# Patient Record
Sex: Female | Born: 1975
Health system: Southern US, Community
[De-identification: ages and names within clinical notes are randomized; demographics above are authoritative.]

## PROBLEM LIST (undated history)

## (undated) DIAGNOSIS — B009 Herpesviral infection, unspecified: Secondary | ICD-10-CM

## (undated) DIAGNOSIS — Z8673 Personal history of transient ischemic attack (TIA), and cerebral infarction without residual deficits: Secondary | ICD-10-CM

## (undated) DIAGNOSIS — F17210 Nicotine dependence, cigarettes, uncomplicated: Secondary | ICD-10-CM

## (undated) DIAGNOSIS — G43909 Migraine, unspecified, not intractable, without status migrainosus: Secondary | ICD-10-CM

## (undated) DIAGNOSIS — E785 Hyperlipidemia, unspecified: Secondary | ICD-10-CM

## (undated) DIAGNOSIS — Z87898 Personal history of other specified conditions: Secondary | ICD-10-CM

## (undated) HISTORY — DX: Nicotine dependence, cigarettes, uncomplicated: F17.210

## (undated) HISTORY — DX: Herpesviral infection, unspecified: B00.9

## (undated) HISTORY — DX: Migraine, unspecified, not intractable, without status migrainosus: G43.909

## (undated) HISTORY — DX: Personal history of other specified conditions: Z87.898

## (undated) HISTORY — DX: Hyperlipidemia, unspecified: E78.5

## (undated) HISTORY — DX: Personal history of transient ischemic attack (TIA), and cerebral infarction without residual deficits: Z86.73

## (undated) HISTORY — PX: OTHER SURGICAL HISTORY: SHX169

---

## 1998-04-12 ENCOUNTER — Ambulatory Visit (HOSPITAL_COMMUNITY): Admission: RE | Admit: 1998-04-12 | Discharge: 1998-04-12 | Payer: Self-pay | Admitting: Obstetrics and Gynecology

## 1998-05-30 ENCOUNTER — Inpatient Hospital Stay (HOSPITAL_COMMUNITY): Admission: AD | Admit: 1998-05-30 | Discharge: 1998-05-30 | Payer: Self-pay | Admitting: *Deleted

## 1998-05-31 ENCOUNTER — Inpatient Hospital Stay (HOSPITAL_COMMUNITY): Admission: AD | Admit: 1998-05-31 | Discharge: 1998-06-02 | Payer: Self-pay | Admitting: Obstetrics and Gynecology

## 2006-11-08 ENCOUNTER — Ambulatory Visit: Payer: Self-pay | Admitting: Pulmonary Disease

## 2007-06-27 ENCOUNTER — Ambulatory Visit: Payer: Self-pay | Admitting: Pulmonary Disease

## 2007-06-27 LAB — CONVERTED CEMR LAB
AST: 21 units/L (ref 0–37)
Alkaline Phosphatase: 60 units/L (ref 39–117)
Bilirubin, Direct: 0.1 mg/dL (ref 0.0–0.3)
CO2: 31 meq/L (ref 19–32)
Chloride: 110 meq/L (ref 96–112)
Eosinophils Relative: 1.8 % (ref 0.0–5.0)
GFR calc non Af Amer: 90 mL/min
Glucose, Bld: 94 mg/dL (ref 70–99)
HCT: 40.1 % (ref 36.0–46.0)
MCV: 87.7 fL (ref 78.0–100.0)
Monocytes Relative: 5.9 % (ref 3.0–11.0)
Sodium: 146 meq/L — ABNORMAL HIGH (ref 135–145)
TSH: 1.78 microintl units/mL (ref 0.35–5.50)
Total Protein: 6.9 g/dL (ref 6.0–8.3)
WBC: 9.4 10*3/uL (ref 4.5–10.5)

## 2007-07-10 ENCOUNTER — Encounter: Admission: RE | Admit: 2007-07-10 | Discharge: 2007-07-10 | Payer: Self-pay

## 2007-07-10 ENCOUNTER — Encounter: Payer: Self-pay | Admitting: Pulmonary Disease

## 2007-07-14 ENCOUNTER — Encounter: Payer: Self-pay | Admitting: Pulmonary Disease

## 2007-07-22 ENCOUNTER — Encounter: Payer: Self-pay | Admitting: Pulmonary Disease

## 2007-07-22 ENCOUNTER — Ambulatory Visit: Payer: Self-pay

## 2007-07-24 ENCOUNTER — Encounter: Payer: Self-pay | Admitting: Pulmonary Disease

## 2007-07-26 ENCOUNTER — Encounter: Payer: Self-pay | Admitting: Pulmonary Disease

## 2007-08-12 ENCOUNTER — Ambulatory Visit: Payer: Self-pay | Admitting: Pulmonary Disease

## 2007-08-15 ENCOUNTER — Inpatient Hospital Stay (HOSPITAL_COMMUNITY): Admission: EM | Admit: 2007-08-15 | Discharge: 2007-08-17 | Payer: Self-pay | Admitting: Emergency Medicine

## 2007-08-16 ENCOUNTER — Encounter: Payer: Self-pay | Admitting: Pulmonary Disease

## 2007-08-23 ENCOUNTER — Ambulatory Visit: Payer: Self-pay | Admitting: Pulmonary Disease

## 2007-08-23 LAB — CONVERTED CEMR LAB
ALT: 59 units/L — ABNORMAL HIGH (ref 0–35)
Alkaline Phosphatase: 56 units/L (ref 39–117)
BUN: 15 mg/dL (ref 6–23)
Basophils Relative: 0 % (ref 0.0–1.0)
GFR calc non Af Amer: 69 mL/min
Glucose, Bld: 85 mg/dL (ref 70–99)
HCT: 41.9 % (ref 36.0–46.0)
Hemoglobin: 14.8 g/dL (ref 12.0–15.0)
Lymphocytes Relative: 38.5 % (ref 12.0–46.0)
MCHC: 35.4 g/dL (ref 30.0–36.0)
MCV: 86.9 fL (ref 78.0–100.0)
Monocytes Relative: 8.5 % (ref 3.0–11.0)
Neutro Abs: 4.4 10*3/uL (ref 1.4–7.7)
Neutrophils Relative %: 50.8 % (ref 43.0–77.0)
RBC: 4.82 M/uL (ref 3.87–5.11)
Sodium: 141 meq/L (ref 135–145)
Total Bilirubin: 0.6 mg/dL (ref 0.3–1.2)
Total Protein: 6.7 g/dL (ref 6.0–8.3)
WBC: 8.6 10*3/uL (ref 4.5–10.5)

## 2007-09-03 ENCOUNTER — Encounter: Payer: Self-pay | Admitting: Pulmonary Disease

## 2007-09-08 ENCOUNTER — Encounter: Payer: Self-pay | Admitting: Pulmonary Disease

## 2007-10-07 ENCOUNTER — Ambulatory Visit: Payer: Self-pay | Admitting: Pulmonary Disease

## 2007-11-11 ENCOUNTER — Encounter: Payer: Self-pay | Admitting: Pulmonary Disease

## 2007-11-17 LAB — CONVERTED CEMR LAB
ALT: 31 units/L (ref 0–35)
Alkaline Phosphatase: 59 units/L (ref 39–117)
CO2: 28 meq/L (ref 19–32)
Calcium: 9.3 mg/dL (ref 8.4–10.5)
GFR calc non Af Amer: 78 mL/min
Glucose, Bld: 86 mg/dL (ref 70–99)
Potassium: 3.8 meq/L (ref 3.5–5.1)
Sodium: 139 meq/L (ref 135–145)

## 2007-12-06 ENCOUNTER — Ambulatory Visit: Payer: Self-pay | Admitting: Pulmonary Disease

## 2007-12-06 DIAGNOSIS — B009 Herpesviral infection, unspecified: Secondary | ICD-10-CM | POA: Insufficient documentation

## 2007-12-06 DIAGNOSIS — I6350 Cerebral infarction due to unspecified occlusion or stenosis of unspecified cerebral artery: Secondary | ICD-10-CM | POA: Insufficient documentation

## 2007-12-06 DIAGNOSIS — R55 Syncope and collapse: Secondary | ICD-10-CM | POA: Insufficient documentation

## 2007-12-06 DIAGNOSIS — G43909 Migraine, unspecified, not intractable, without status migrainosus: Secondary | ICD-10-CM | POA: Insufficient documentation

## 2007-12-08 ENCOUNTER — Ambulatory Visit: Payer: Self-pay | Admitting: Pulmonary Disease

## 2007-12-08 LAB — CONVERTED CEMR LAB
Alkaline Phosphatase: 56 units/L (ref 39–117)
Bilirubin, Direct: 0.1 mg/dL (ref 0.0–0.3)
Cholesterol: 155 mg/dL (ref 0–200)
Eosinophils Absolute: 0.2 10*3/uL (ref 0.0–0.6)
Hemoglobin: 14.8 g/dL (ref 12.0–15.0)
Lymphocytes Relative: 31.6 % (ref 12.0–46.0)
MCHC: 33 g/dL (ref 30.0–36.0)
MCV: 90.1 fL (ref 78.0–100.0)
Monocytes Absolute: 0.6 10*3/uL (ref 0.2–0.7)
Neutrophils Relative %: 57.3 % (ref 43.0–77.0)
Platelets: 284 10*3/uL (ref 150–400)
RBC: 4.98 M/uL (ref 3.87–5.11)
RDW: 12.1 % (ref 11.5–14.6)
Sodium: 142 meq/L (ref 135–145)
TSH: 2.01 microintl units/mL (ref 0.35–5.50)
Total Bilirubin: 0.7 mg/dL (ref 0.3–1.2)
Total Protein: 6.5 g/dL (ref 6.0–8.3)

## 2007-12-17 DIAGNOSIS — F17200 Nicotine dependence, unspecified, uncomplicated: Secondary | ICD-10-CM | POA: Insufficient documentation

## 2007-12-17 DIAGNOSIS — E785 Hyperlipidemia, unspecified: Secondary | ICD-10-CM

## 2007-12-17 DIAGNOSIS — H6690 Otitis media, unspecified, unspecified ear: Secondary | ICD-10-CM

## 2008-01-02 ENCOUNTER — Emergency Department (HOSPITAL_COMMUNITY): Admission: EM | Admit: 2008-01-02 | Discharge: 2008-01-02 | Payer: Self-pay | Admitting: Emergency Medicine

## 2008-01-03 ENCOUNTER — Telehealth (INDEPENDENT_AMBULATORY_CARE_PROVIDER_SITE_OTHER): Payer: Self-pay | Admitting: *Deleted

## 2008-01-06 ENCOUNTER — Encounter: Payer: Self-pay | Admitting: Pulmonary Disease

## 2008-01-08 ENCOUNTER — Emergency Department (HOSPITAL_COMMUNITY): Admission: EM | Admit: 2008-01-08 | Discharge: 2008-01-08 | Payer: Self-pay | Admitting: Emergency Medicine

## 2008-01-09 ENCOUNTER — Encounter: Payer: Self-pay | Admitting: Pulmonary Disease

## 2008-01-09 ENCOUNTER — Ambulatory Visit: Payer: Self-pay | Admitting: Internal Medicine

## 2008-01-09 LAB — CONVERTED CEMR LAB
ALT: 31 units/L (ref 0–35)
AST: 24 units/L (ref 0–37)
BUN: 13 mg/dL (ref 6–23)
Chloride: 109 meq/L (ref 96–112)
Creatinine, Ser: 0.9 mg/dL (ref 0.4–1.2)
GFR calc Af Amer: 95 mL/min
HDL: 28.4 mg/dL — ABNORMAL LOW (ref >39.0)
Hgb A1c MFr Bld: 5.5 % (ref 4.6–6.0)
LDL Cholesterol: 73 mg/dL (ref 0–99)
Sodium: 145 meq/L (ref 135–145)
Total Bilirubin: 0.8 mg/dL (ref 0.3–1.2)
Triglycerides: 73 mg/dL (ref 0–149)

## 2008-01-18 ENCOUNTER — Encounter: Payer: Self-pay | Admitting: Pulmonary Disease

## 2008-01-20 ENCOUNTER — Encounter: Payer: Self-pay | Admitting: Pulmonary Disease

## 2008-01-23 ENCOUNTER — Inpatient Hospital Stay (HOSPITAL_COMMUNITY): Admission: AD | Admit: 2008-01-23 | Discharge: 2008-01-26 | Payer: Self-pay | Admitting: Neurology

## 2008-01-30 ENCOUNTER — Encounter: Payer: Self-pay | Admitting: Pulmonary Disease

## 2008-02-06 ENCOUNTER — Telehealth: Payer: Self-pay | Admitting: Pulmonary Disease

## 2008-02-13 ENCOUNTER — Encounter: Payer: Self-pay | Admitting: Pulmonary Disease

## 2008-02-23 ENCOUNTER — Ambulatory Visit: Payer: Self-pay | Admitting: Cardiology

## 2008-02-23 LAB — CONVERTED CEMR LAB
Basophils Absolute: 0.1 10*3/uL (ref 0.0–0.1)
Basophils Relative: 0.7 % (ref 0.0–1.0)
CO2: 26 meq/L (ref 19–32)
Calcium: 9.4 mg/dL (ref 8.4–10.5)
Eosinophils Relative: 2 % (ref 0.0–5.0)
GFR calc Af Amer: 95 mL/min
GFR calc non Af Amer: 78 mL/min
Glucose, Bld: 130 mg/dL — ABNORMAL HIGH (ref 70–99)
Lymphocytes Relative: 31.7 % (ref 12.0–46.0)
MCHC: 34.1 g/dL (ref 30.0–36.0)
Monocytes Relative: 5.9 % (ref 3.0–12.0)
RBC: 4.65 M/uL (ref 3.87–5.11)
Sodium: 141 meq/L (ref 135–145)

## 2008-05-04 ENCOUNTER — Encounter: Payer: Self-pay | Admitting: Pulmonary Disease

## 2008-05-08 ENCOUNTER — Ambulatory Visit: Payer: Self-pay | Admitting: Internal Medicine

## 2008-05-09 DIAGNOSIS — R030 Elevated blood-pressure reading, without diagnosis of hypertension: Secondary | ICD-10-CM

## 2008-06-12 ENCOUNTER — Ambulatory Visit: Payer: Self-pay | Admitting: Internal Medicine

## 2009-01-29 ENCOUNTER — Emergency Department (HOSPITAL_COMMUNITY): Admission: EM | Admit: 2009-01-29 | Discharge: 2009-01-29 | Payer: Self-pay | Admitting: Family Medicine

## 2009-01-29 ENCOUNTER — Ambulatory Visit: Payer: Self-pay | Admitting: Internal Medicine

## 2009-01-29 DIAGNOSIS — L723 Sebaceous cyst: Secondary | ICD-10-CM | POA: Insufficient documentation

## 2009-01-29 DIAGNOSIS — L02419 Cutaneous abscess of limb, unspecified: Secondary | ICD-10-CM

## 2009-01-29 DIAGNOSIS — L03119 Cellulitis of unspecified part of limb: Secondary | ICD-10-CM

## 2009-03-05 ENCOUNTER — Ambulatory Visit: Payer: Self-pay | Admitting: Internal Medicine

## 2009-03-06 ENCOUNTER — Encounter: Payer: Self-pay | Admitting: Adult Health

## 2009-03-06 ENCOUNTER — Ambulatory Visit: Payer: Self-pay | Admitting: Pulmonary Disease

## 2009-03-07 ENCOUNTER — Encounter: Payer: Self-pay | Admitting: Adult Health

## 2009-03-08 LAB — CONVERTED CEMR LAB
ALT: 27 U/L
AST: 19 U/L
Albumin: 4.1 g/dL
Alkaline Phosphatase: 60 U/L
BUN: 14 mg/dL
Basophils Absolute: 0 10*3/uL
Basophils Relative: 0.3 %
Bilirubin, Direct: 0.1 mg/dL
CO2: 26 meq/L
Calcium: 9.3 mg/dL
Chloride: 112 meq/L
Cholesterol: 162 mg/dL
Creatinine, Ser: 1 mg/dL
Eosinophils Absolute: 0.3 10*3/uL
Eosinophils Relative: 5.4 % — ABNORMAL HIGH
GFR calc non Af Amer: 68.46 mL/min
Glucose, Bld: 100 mg/dL — ABNORMAL HIGH
HCT: 43.4 %
HDL: 32.4 mg/dL — ABNORMAL LOW
Hemoglobin: 15 g/dL
LDL Cholesterol: 112 mg/dL — ABNORMAL HIGH
Lymphocytes Relative: 22.6 %
Lymphs Abs: 1.3 10*3/uL
MCHC: 34.5 g/dL
MCV: 89 fL
Monocytes Absolute: 0.7 10*3/uL
Monocytes Relative: 11.8 %
Neutro Abs: 3.3 10*3/uL
Neutrophils Relative %: 59.9 %
Platelets: 253 10*3/uL
Potassium: 3.8 meq/L
RBC: 4.88 M/uL
RDW: 12.1 %
Sodium: 144 meq/L
TSH: 1.1 u[IU]/mL
Total Bilirubin: 0.7 mg/dL
Total CHOL/HDL Ratio: 5
Total Protein: 7.3 g/dL
Triglycerides: 89 mg/dL
VLDL: 17.8 mg/dL
Vit D, 25-Hydroxy: 25 ng/mL — ABNORMAL LOW
WBC: 5.6 10*3/uL

## 2009-08-17 IMAGING — CT CT HEAD W/O CM
1 of 2 series · 16 of 30 positions shown, 20 images · non-contrast
Comparison: No prior CT.  There is an MRI from 08/16/2007.

CLINICAL DATA: Seizure/history of recent CVA

CT HEAD WITHOUT CONTRAST
TECHNIQUE: Contiguous axial images were obtained from the base of
the skull through the vertex without contrast.

[Series 3: head trauma 2.4 h60s · axial · 0.49mm/px · z∈[-178,-20]mm · 16 of 72 slices shown, 20 images]
[im 4/72  brain]
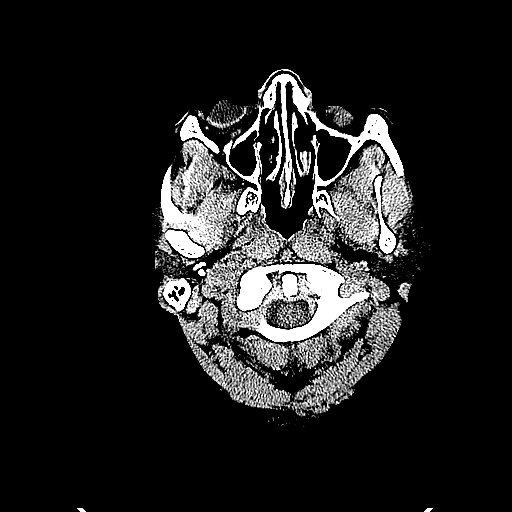
[im 4/72  bone]
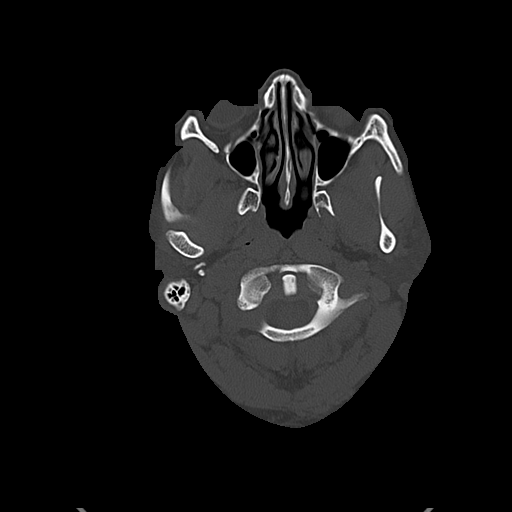
[im 8/72  brain]
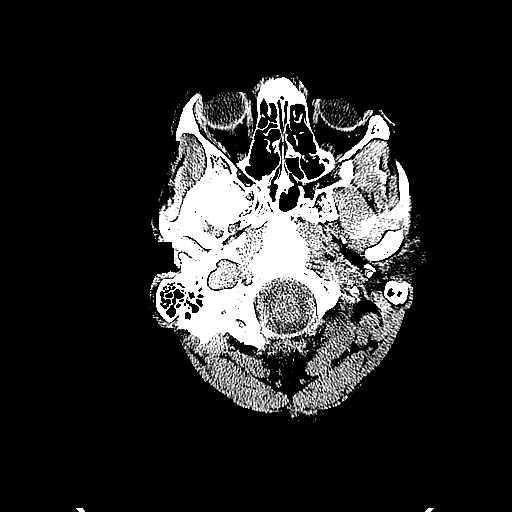
[im 12/72  brain]
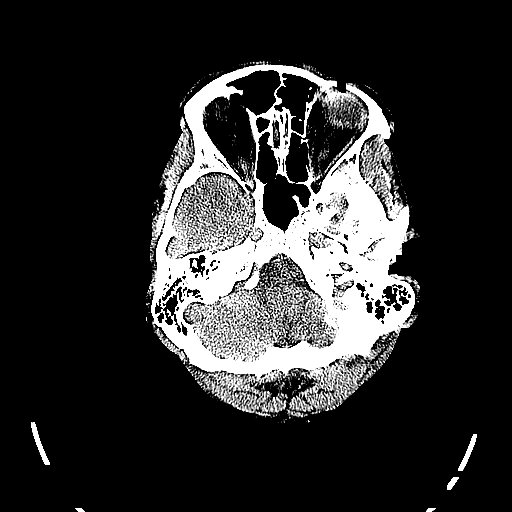
[im 15/72  brain]
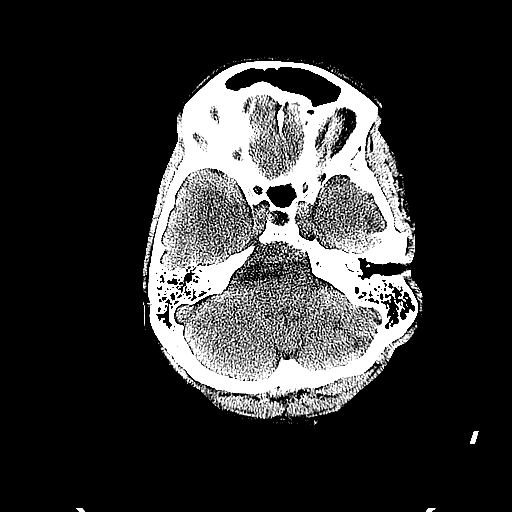
[im 23/72  brain]
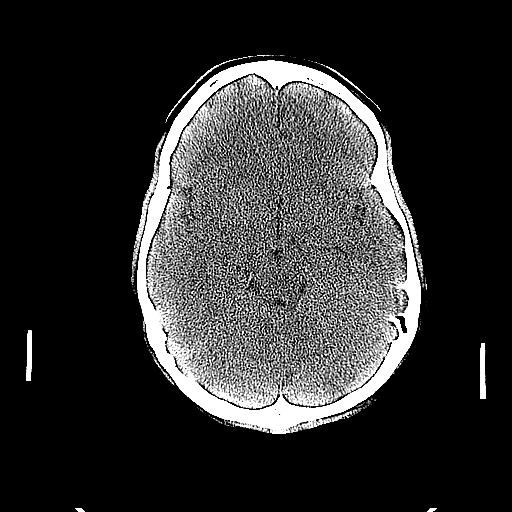
[im 23/72  bone]
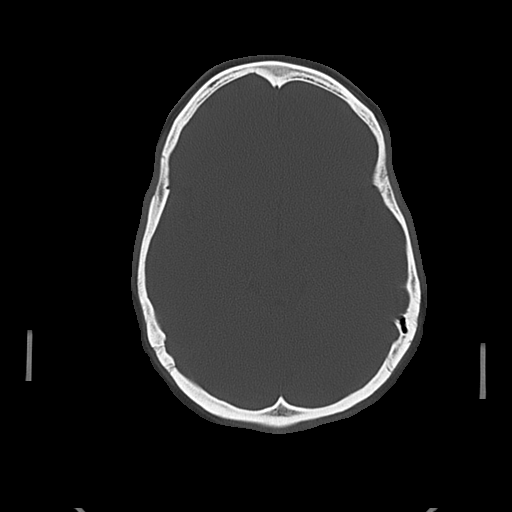
[im 27/72  brain]
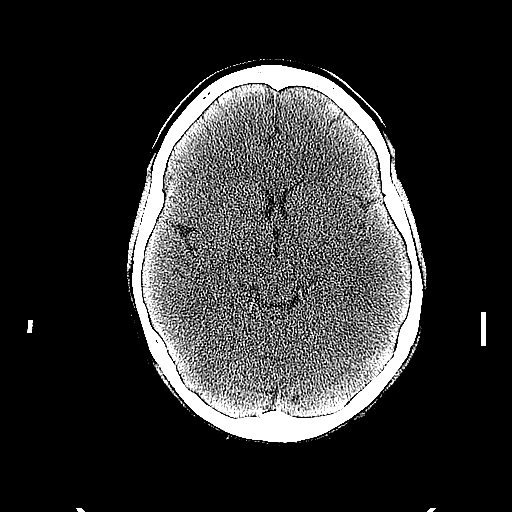
[im 30/72  brain]
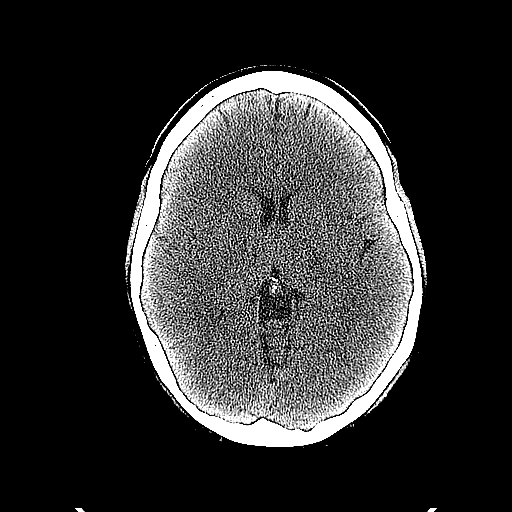
[im 34/72  brain]
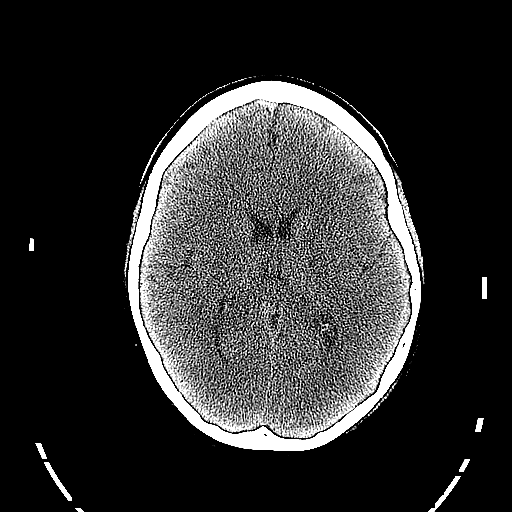
[im 38/72  brain]
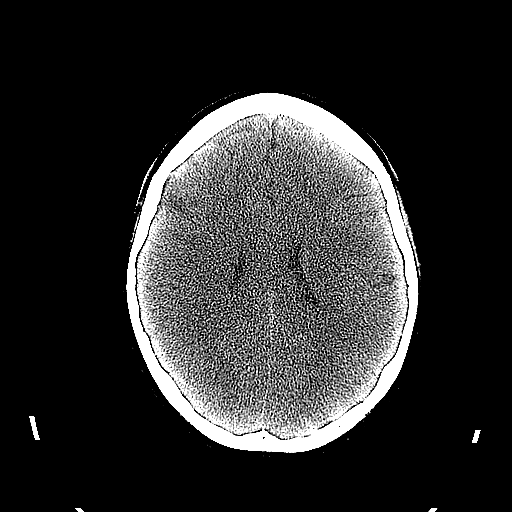
[im 38/72  bone]
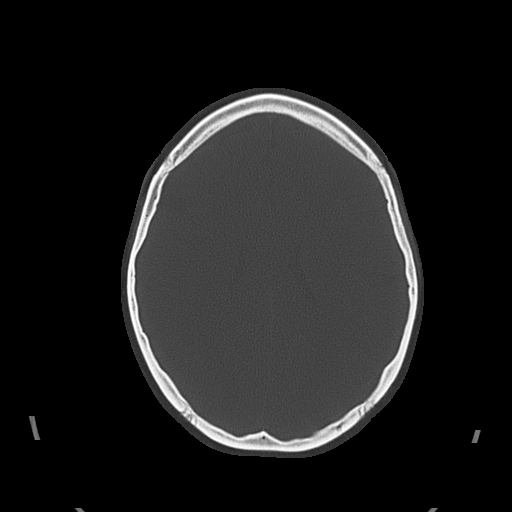
[im 42/72  brain]
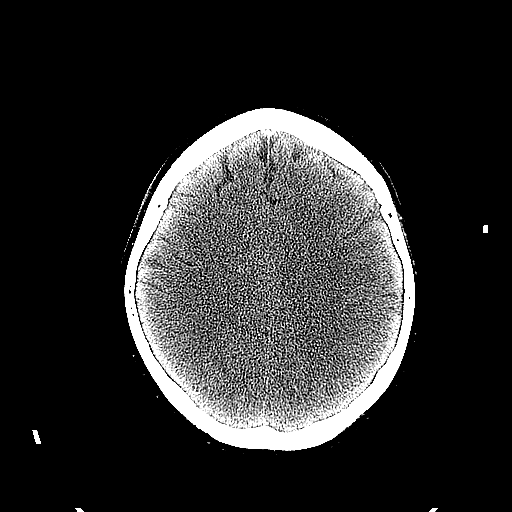
[im 45/72  brain]
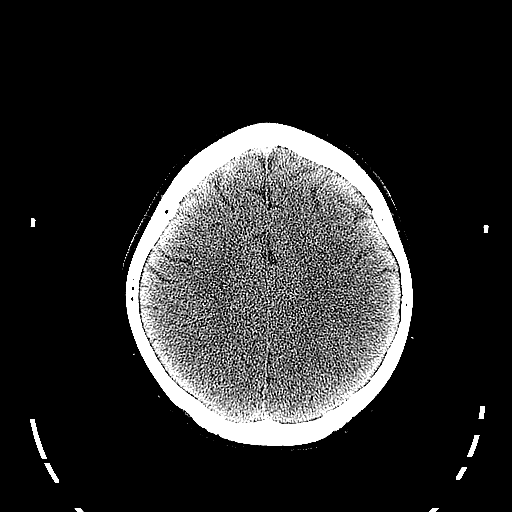
[im 49/72  brain]
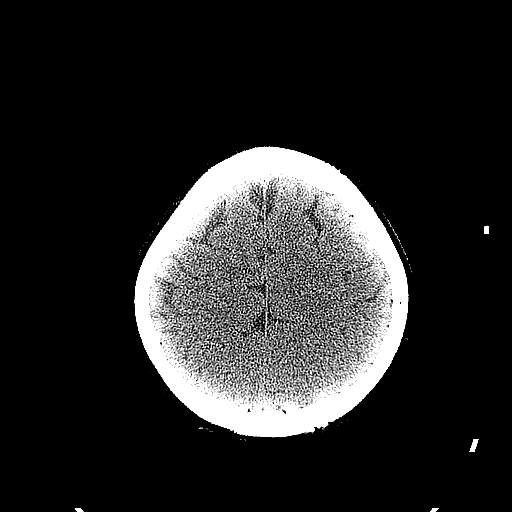
[im 57/72  brain]
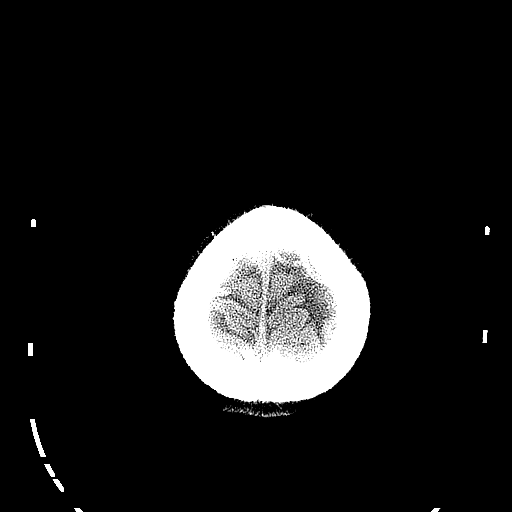
[im 57/72  bone]
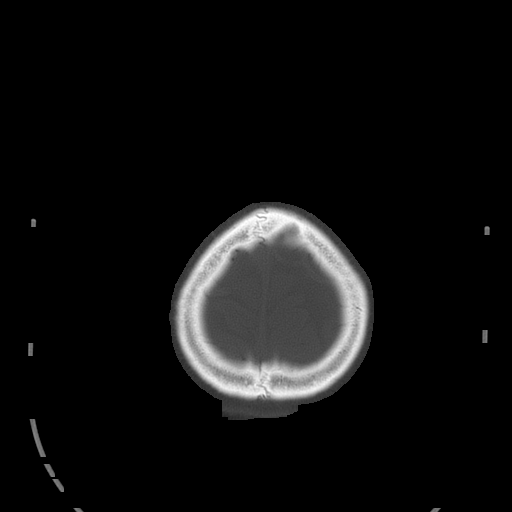
[im 60/72  brain]
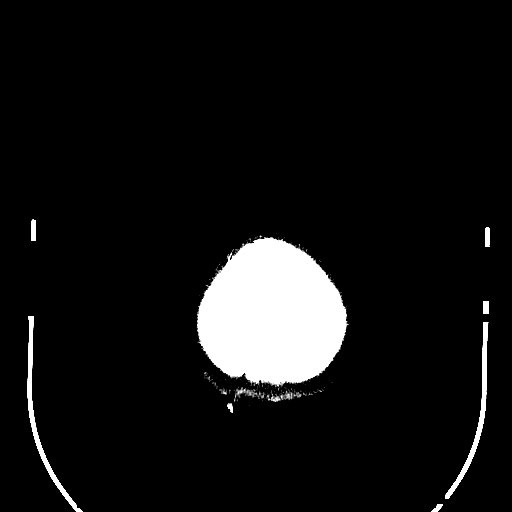
[im 64/72  brain]
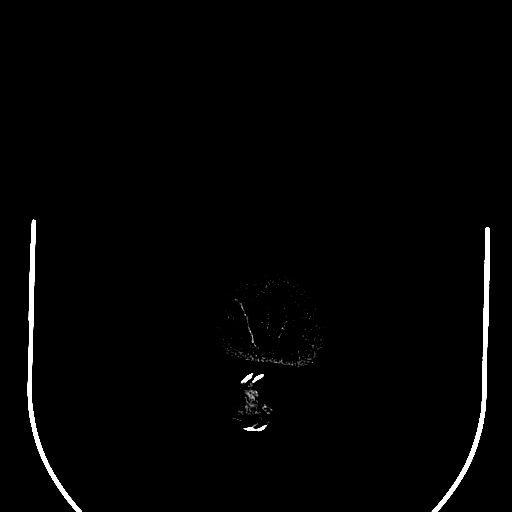
[im 68/72  brain]
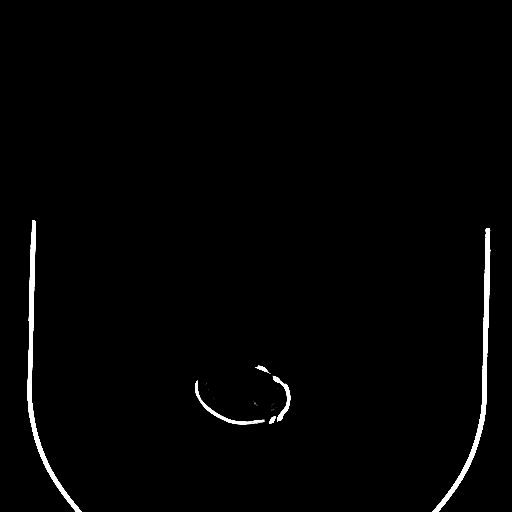

[16 of 30 positions shown; findings below may reference images not displayed]

FINDINGS: Ventricular size and CSF spaces normal.  No acute
infarct, bleed, or mass effect.  Calvarium intact.  No fluid in the
sinuses visualized.
IMPRESSION: No acute or focal abnormality.

## 2010-08-04 ENCOUNTER — Ambulatory Visit: Payer: Self-pay | Admitting: Pulmonary Disease

## 2010-08-04 ENCOUNTER — Encounter: Payer: Self-pay | Admitting: Adult Health

## 2010-08-07 DIAGNOSIS — J069 Acute upper respiratory infection, unspecified: Secondary | ICD-10-CM | POA: Insufficient documentation
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
## 2010-10-28 NOTE — Letter (Signed)
Summary: Out of Work  [employer]  [address]   [place], [place] [address]   Phone: [phone]  Fax: [phone]    August 04, 2010   Employee:  [name]    To Whom It May Concern:   For Medical reasons, please excuse the above named employee from work for the following dates:  Start:    End:    If you need additional information, please feel free to contact our office.         Sincerely,    Boone Master CNA/MA

## 2010-10-28 NOTE — Letter (Signed)
Summary: Out of Work  [employer]  [address]   [place], [place] [address]   Phone: [phone]  Fax: [phone]    August 04, 2010   Employee:  [name]    To Whom It May Concern:   For Medical reasons, please excuse the above named employee from work for the following dates:  Start:   Monday August 04, 2010  End:   return to work Wednesday August 06, 2010  If you need additional information, please feel free to contact our office.         Sincerely,        Tammy Parrett, N.P.

## 2011-01-06 LAB — CULTURE, ROUTINE-ABSCESS

## 2011-02-10 NOTE — Discharge Summary (Signed)
[name], [name]          ACCOUNT NO.:  [id number]   MEDICAL RECORD NO.:  [id number]          PATIENT TYPE:  INP   LOCATION:  3005                         FACILITY:  [facility]   PHYSICIAN:  [name], Amanda MilesDATE OF BIRTH:  06/10/1976   DATE OF ADMISSION:  01/23/2008  DATE OF DISCHARGE:  01/26/2008                               DISCHARGE SUMMARY   FINAL DIAGNOSIS:  1. Psychogenic nonepileptogenic seizures, 780.39.  2. 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  3. History of syncope, 780.2.  4. Dyslipidemia, 272.4.  5. Migraines, 346.10.   PROCEDURES:  Video telemetry EEG 48 hours from 10:40 a.m. January 24, 2008  to 10:15 January 26, 2008.   COMPLICATIONS:  None.   At the time of the hospitalization, the patient was monitored with video  telemetry EEG.  The entire record has not been fully reviewed; however,  the patient after having Keppra discontinued had an episode around 12:15  p.m. on January 25, 2008.  This lasted for several minutes.  During that  time, the patient was staring, sitting up unresponsive.  She finally  responded to ammonia pledgets.  There was no change in her normal waking  background activity before the episode, during it, or afterwards.   In reviewing the first 4 hours of the record, I found some of posterior  sharp waves of questionable significance.  The remainder of the record  needs to be reviewed to be certain that there were no epileptic events  that were not noted with push button.  However, this push button event  which was typical of her behaviors and was nonepileptic in nature.   DISCHARGE MEDICATIONS:  The patient will be discharged to home on the  following medications;  1. Aspirin 81 mg daily.  2. Lipitor 10 mg daily.  3. Lasix 20 mg as needed.  4. Potassium chloride 20 mEq daily.  5. Keppra will be discontinued.   FOLLOW-UP:  The patient will follow-up with Dr. Genene Churn. Miles.  This is  being discussed with him.  He is in  agreement.  It was also discussed  with the family and they voiced understanding.  At this time, there is  no treatment, but will stop these behaviors, and this would include all  antiepileptic drugs.      [name], M.D.  Electronically Signed     WHH/MEDQ  D:  01/26/2008  T:  01/27/2008  Job:  [id number]   cc:   [name], M.D.

## 2011-02-10 NOTE — Discharge Summary (Signed)
NAMESALVADOR, [name]          ACCOUNT NO.:  [id number]   MEDICAL RECORD NO.:  [id number]          PATIENT TYPE:  INP   LOCATION:  3038                         FACILITY:  [facility]   PHYSICIAN:  Bevelyn Buckles. Champey, M.D.DATE OF BIRTH:  11-Sep-1976   DATE OF ADMISSION:  08/15/2007  DATE OF DISCHARGE:                               DISCHARGE SUMMARY   ADMITTING DIAGNOSIS:  Syncope versus seizure.   DISCHARGE DIAGNOSIS:  Syncope versus seizure.   SECONDARY DIAGNOSES:  Includes:  1. Stroke.  2. Bilateral ear infections.   DISCHARGE MEDICATIONS:  Include:  1. Aspirin 325 mg per day.  2. Lipitor 10 mg per day.  3. Amoxicillin 500 mg 1 tablet p.o. t.i.d. for 9 days.   DISCHARGE INSTRUCTIONS:  The patient is told to follow-up with Dr.  Kriste Basque, the patient's primary care physician, in 1-2 months and Dr.  Nash Shearer, neurologist, within 1 month.  She was also given a prescription  for outpatient physical therapy for gait, balance training, dizziness.   HOSPITAL COURSE:  Please see admission H&P from Dr. Ellison Carwin for  details of admission.  The patient was admitted after syncopal versus  seizure episode.  The patient had EEG which showed some mild slowing in  the background.  No epileptiform activity was noticed.  The patient was  admitted to the hospital and observed.  She had a MRI of the brain that  showed no acute abnormality with old cerebellar strokes.  She had an  uneventful hospital stay and was slightly unsteady on feet.  PT  recommended some outpatient therapy.  The patient did have blood work  drawn that showed some slightly elevated AST and ALT at 42 and 56,  respectively.  When repeated, they were 39 and 65, respectively.  The  patient had an uncomplicated hospital stay, and no further events were  noted.  On August 17, 2007, the patient was in stable condition and  ready for discharge.  She was discharged home on the above medication  with plans of repeating her EEG in a  few week's time, also getting  outpatient therapy.  It was also recommended the patient have her LFTs  recheck within the next month for follow-up and possibly further  evaluation if enzymes remained elevated.  This can be done by Dr. Kriste Basque.  We will follow up the patient within the next month and go over her EEG.  The patient is told to return to the emergency room if any recurrent  events occur.  Discharge the patient home, and the patient is instructed  not to drive for least 3-6 months secondary to her  syncopal/seizure episode with loss of consciousness.  The patient was  also told to be cautious with certain activity such as bathing, going  swimming, while on or operating heavy machinery.  We will follow up with  the patient.  She was discharged home with her family.  She was told to  call with any questions or concerns.      Bevelyn Buckles. Nash Shearer, M.D.  Electronically Signed     DRC/MEDQ  D:  08/17/2007  T:  08/17/2007  Job:  [id number]

## 2011-02-10 NOTE — Assessment & Plan Note (Signed)
[facility]                             PULMONARY OFFICE NOTE   Amanda, Miles                 MRN:          [id number]  DATE:08/12/2007                            DOB:          [birth year]    HISTORY OF PRESENT ILLNESS:  The patient is a 35 year old white female  patient of Dr. Jodelle Green who presents today complaining of intermittent  dizziness and lightheadedness.  The patient complains that she feels off  balance when she turns her head or moves quickly.  The patient has had  some intermittent ear pressure.  The patient has recently been evaluated  by neurology.  She initially started having symptoms approximately 6  weeks ago of right eye disturbance with blurry to loss of vision in the  right eye.  She was subsequently referred over for an MRI by her  ophthalmologist which showed multiple subacute cerebellar infarcts with  predominant involvement in the lateral and inferior cerebellum on the  left.  The patient was then referred to Dr. Nash Shearer who recommended that  the patient be placed on aspirin therapy.  She recently underwent an MRA  of her brain and neck which showed patent vertebral basilar system and a  slight narrowing of the origin of the right external carotid artery  which was unclear of the significance.  Her visual evoked potentials  were within normal limits.  A MRA of the head showed no aneurysms, with  good flow throughout.  The patient reports that she has not regained her  vision in her right eye.  She does not have any speech or extremity  impairment.  The patient has quit smoking and is getting ready to begin  Lipitor.  The patient denies any chest pain, palpitations, presyncopal  or syncopal episodes.   PAST MEDICAL HISTORY:  Reviewed.   CURRENT MEDICATIONS:  Reviewed.   PHYSICAL EXAMINATION:  GENERAL:  The patient is a very pleasant female  in no acute distress.  VITAL SIGNS:  She is afebrile with stable vital  signs.  Her O2  saturation is 99% on room air.  HEENT:  PERRLA.  EOMI without nystagmus.  Conjunctivae not injected.  Posterior pharynx clear.  NECK:  Supple without cervical adenopathy.  No JVD.  Carotids are equal  with positive upstrokes bilaterally.  No bruits.  LUNGS:  Lung sounds are clear.  CARDIAC:  S1 and S2 without murmurs, rubs, or gallops.  ABDOMEN:  Soft, nontender.  No palpable hepatosplenomegaly.  EXTREMITIES:  Warm without any calf cyanosis, clubbing, or edema.  NEUROLOGIC:  Alert and oriented x3.  Cranial nerves II-XII are intact.  Negative pronator drift.  Negative tremor.  Had been negative for  reproducible symptoms, negative for nystagmus.  Steady gait.  Heel-to-  toe walking is normal.   IMPRESSION AND PLAN:  Dizziness.  Suspect this is representative of some  underlying vertigo.  However, the patient has recently had the above  subacute cerebellar infarcts.  The patient does not appear to have any  new neurological deficits.  Have recommended that she continue to follow  up with Dr. Nash Shearer as scheduled.  Continue  on aspirin daily.  Will  begin Meclizine 25 mg one-half to whole up to 3 times a day.  The  patient is advised if symptoms do not improve or worsen or are  associated with any other neurological deficits, she is to contact us  immediately or go to the closest emergency department which I have  recommended [facility] which has a stroke team.      Amanda Oaks, NP  Electronically Signed      [name]. Amanda Basque, MD  Electronically Signed   TP/MedQ  DD: 08/12/2007  DT: 08/14/2007  Job #: [id number]
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
## 2011-02-10 NOTE — Procedures (Signed)
EEG NUMBER:  [id number].   CLINICAL HISTORY:  A 35 year old with history of strokes x2 in the  cerebellar region approximately 2 months ago.  She has had headaches.  The patient had a syncopal episode with possible seizure activity.  Study is being done to look for the presence of seizures, (780.2,  780.39).   PROCEDURE:  The tracing is carried out on a 32-channel digital Cadwell  recorder reformatted into 16-channel montages with 1 devoted to EKG.  The patient was awake during the recording.  The International 10/20  system of lead placement was used.   MEDICATIONS:  Include meclizine, aspirin, amoxicillin.   DESCRIPTION OF FINDINGS:  Dominant frequency is a 13 Hz alpha range  activity superimposed upon mixed frequency predominately theta and alpha  range activity.  Under 10 microvolt 20 Hz theta range activity was seen.   There was no interictal epileptiform activity in the form of spikes or  sharp waves.   IMPRESSION:  Mildly abnormal EEG on the basis of mild diffuse slowing  into the theta range.  This may be a postictal record.  There is no  focality or seizures in the record.      Deanna Artis. Sharene Skeans, M.D.  Electronically Signed     EAV:WUJW  D:  08/15/2007 23:39:39  T:  08/16/2007 12:15:15  Job #:  [id number]   cc:   Deanna Artis. Sharene Skeans, M.D.  Fax: [phone]

## 2011-02-10 NOTE — Discharge Summary (Signed)
[name], [name]          ACCOUNT NO.:  [id number]   MEDICAL RECORD NO.:  [id number]          PATIENT TYPE:  INP   LOCATION:  3038                         FACILITY:  [facility]   PHYSICIAN:  [name]. Champey, M.D.DATE OF BIRTH:  January 04, 1976   DATE OF ADMISSION:  08/15/2007  DATE OF DISCHARGE:                               DISCHARGE SUMMARY   ADDENDUM TO DISCHARGE SUMMARY:  Please send a copy of this discharge summary to Dr. Kriste Basque, the patient's  primary care physician.      [name]. [name], M.D.  Electronically Signed     DRC/MEDQ  D:  08/17/2007  T:  08/17/2007  Job:  [id number]

## 2011-02-10 NOTE — Procedures (Signed)
EEG NUMBER:  [id number].   CLINICAL HISTORY:  The patient is a 35 year old with cerebellar strokes  who has had episodes of unresponsive staring.  Studies being done to  look for the presence of seizures (780.02).   PROCEDURE:  The tracing is carried out on a 32-channel digital Cadwell  recorder reformatted into 16-channel montages with one devoted to EKG.  The patient was awake and asleep during the recording.  Recording took  place from 10:14:47 on January 23, 2008, to 10:49 on January 24, 2008; from  12:17:15 on January 24, 2008 to 08:40:07 on April 29,2009; and from 08:45  January 25, 2008 to 07:09 on January 26, 2008.  This represents  approximately 66 hours of recording.   The international 10/20 system lead placement was used.  The leads were  paced for the first 24 hours and collodion for the last 2 days.  Medications include K-Dur, Lasix, Lovenox, Lipitor, aspirin, and for the  first 48 hours Keppra; and the last 24 hours the Keppra was  discontinued.   DESCRIPTION FINDINGS:  Dominant frequency is a 11-Hz 30 to 50 microvolt  alpha range activity; 110 microvolt beta range activity was seen and  occasional theta range components were seen.   The patient becomes drowsy with mixed frequency of 30-microvolt theta  range activity.  The patient shows sleep with the delta range  background, vertex sharp waves, and symmetric and synchronous sleep  spindles.   There were rare sharp waves seen during the first day in the posterior  region.  These were not definitely epileptogenic from electrographic  viewpoint.   There were 5 push button events on January 25, 2008; at 11:31 a.m.,  12:09p.m., 12:12p.m., 12: 30p.m., and 1:27 p.m.  During all these, the  patient appeared to be leaning forward with her head down, eyelids  closed, and unresponsive.  The first episode, however, occurred when the  patient was off the monitoring for both video and telemetry.  In other 5  cases that could be assessed,  the background showed a normal waking  record and in the one that could not be, as soon as she was back in  telemetry, the waking record was present.   IMPRESSION:  Normal EEG in the waking state and natural sleep.  The 5  push button events were associated with a behavioral seizure associated  with unresponsive staring, but clinically normal waking record  background EEG.  This is consistent with psychogenic nonepileptic events  (780.39).  There was no interictal and no ictal epileptiform  activity form of spikes or sharp waves in this record.  EKG showed  regular sinus rhythm with ventricular response of 72 beats per minute.      Deanna Artis. Sharene Skeans, M.D.  Electronically Signed     XBM:[id number]  D:  01/26/2008 22:53:11  T:  01/27/2008 08:05:55  Job #:  [id number]   cc:   Evie Lacks, MD  Fax: [phone]
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
## 2011-02-13 NOTE — Assessment & Plan Note (Signed)
[facility]                             PULMONARY OFFICE NOTE   [name], [name]                 MRN:          [id number]  DATE:11/08/2006                            DOB:          03/30/1976    HISTORY OF PRESENT ILLNESS:  The patient is a 35 year old white female,  patient of Dr. Jodelle Green who presents for an acute office visit.  The  patient  complains of a 2-day history of nasal congestion, ear fullness,  postnasal drip, nausea and loose stools.  The patient denies any fever,  chest pain, shortness of breath, recent travel or antiobiotic use.  The  patient reports that she started feeling bad yesterday and essentially  last night loose stools began.  The patient also denies any bloody  stools.   PAST MEDICAL HISTORY:  Reviewed.   CURRENT MEDICATIONS:  Reviewed.   PHYSICAL EXAMINATION:  The patient  is a pleasant female in no acute  distress.  She is afebrile with stable vital signs.  O2 saturation is 99 on room  air.  HEENT:  EACs are clear.  Bilateral TMs are red and bulging, right  greater than left.  Nasal mucosa is with some mild redness.  Nontender  sinuses.  Posterior face is clear.  NECK:  Is supple without adenopathy.  LUNGS:  Sounds are clear to auscultation.  CARDIAC:  Is a regular rate and rhythm.  ABDOMEN:  Is soft without any hepatosplenomegaly.  No guarding or  rebound  EXTREMITIES:  Are warm without any calf tenderness, cyanosis, clubbing  or edema.  Negative CVA tenderness.   IMPRESSION/PLAN:  1. Bilateral otitis media.  The patient is to begin Cipro otic drops,      3 drops to each ear twice daily over the next 7 days.  Patient was      given a prescription for Omnicef x5 days.  The patient is to have      this on hold in case symptoms do not improve.  I did not want her      to start while she has got such loose stools.  2. Diarrhea.  I suspect this is viral in nature.  The patient is to      increase fluid  intake.  Advanced      bland diet as tolerated.  The patient may use Imodium AD x1 dose.      The patient is to return if symptoms do not improve or worsen.     Rubye Oaks, NP  Electronically Signed      Lonzo Cloud. Kriste Basque, MD  Electronically Signed   TP/MedQ  DD: 11/08/2006  DT: 11/09/2006  Job #: [id number]

## 2011-06-23 LAB — COMPREHENSIVE METABOLIC PANEL
ALT: 28
AST: 19
Albumin: 3.3 — ABNORMAL LOW
Alkaline Phosphatase: 61
Calcium: 8.6
Creatinine, Ser: 0.98
Potassium: 3.7
Sodium: 141

## 2011-06-23 LAB — CBC
Hemoglobin: 13.4
MCHC: 34.6
WBC: 6.6

## 2011-06-23 LAB — POCT I-STAT, CHEM 8
Calcium, Ion: 1.21
Chloride: 103
HCT: 43
Hemoglobin: 14.6
Sodium: 142

## 2011-07-07 LAB — COMPREHENSIVE METABOLIC PANEL
ALT: 56 — ABNORMAL HIGH
Albumin: 3.4 — ABNORMAL LOW
Alkaline Phosphatase: 54
Calcium: 9.1
Calcium: 9.2
Chloride: 105
Creatinine, Ser: 1.08
GFR calc non Af Amer: >60
Glucose, Bld: 99
Potassium: 4
Sodium: 140
Sodium: 141
Total Bilirubin: 1
Total Protein: 5.9 — ABNORMAL LOW
Total Protein: 6.1

## 2011-07-07 LAB — CBC
HCT: 41.6
Hemoglobin: 14.4
MCHC: 34.4
MCHC: 34.7
MCV: 87.6
MCV: 88.7
Platelets: 286
Platelets: 338
RBC: 4.63
RBC: 4.75
RDW: 12.4
RDW: 12.6
WBC: 8

## 2011-07-07 LAB — DIFFERENTIAL
Basophils Absolute: 0
Basophils Relative: 1
Lymphs Abs: 2.2
Monocytes Absolute: 0.5
Neutrophils Relative %: 67

## 2011-12-17 ENCOUNTER — Encounter: Payer: Self-pay | Admitting: *Deleted

## 2011-12-17 ENCOUNTER — Ambulatory Visit (INDEPENDENT_AMBULATORY_CARE_PROVIDER_SITE_OTHER): Payer: 59 | Admitting: Adult Health

## 2011-12-17 VITALS — BP 112/84 | HR 102 | Temp 97.1°F | Ht 61.0 in | Wt 167.2 lb

## 2011-12-17 DIAGNOSIS — J209 Acute bronchitis, unspecified: Secondary | ICD-10-CM

## 2011-12-17 MED ORDER — CEFDINIR 300 MG PO CAPS: 300.0000 mg | ORAL_CAPSULE | Freq: Two times a day (BID) | ORAL | Status: AC

## 2011-12-17 MED ORDER — HYDROCODONE-HOMATROPINE 5-1.5 MG/5ML PO SYRP: 5.0000 mL | ORAL_SOLUTION | Freq: Four times a day (QID) | ORAL | Status: AC | PRN

## 2011-12-17 NOTE — Assessment & Plan Note (Signed)
Flare with associated sinusitis  Plan:  Omnicef 300mg  Twice daily  For 10 days  Mucinex DM Twice daily  As needed  Cough/congestion  Hydromet 1-2 tsp every 4-6 hr As needed  Cough-may make you sleepy .  Saline nasal rinses As needed   Please contact office for sooner follow up if symptoms do not improve or worsen or seek emergency care  follow up Dr. Kriste Basque  In 1 month for physical.

## 2011-12-17 NOTE — Progress Notes (Signed)
Subjective:    Patient ID: [name], female    DOB: Feb 01, 1976, 36 y.o.   MRN: [id number]  HPI 36 y/o WF with known history of previous CVA, hyperlipidemia and previous episodes of syncope   05/08/08-- Presents for labile b/ps over last several months. Has noted on several occasions that b/p is 140/100. then few hours later will be 110-120/80. Rec conservative monitoring.  June 12, 2008--Complains of 3 days sore throat, right ear pain and pressure, malaise.   Jan 29, 2009-- Presents for large red hard knot on right groin x3days, no tiching/draining/red streaks. also c/o prod cough green mucus x3days, head congestion with no drainage, runny nose - denies f/c/s, SOB and wheezing. no known insect bite.   March 05, 2009--Presents for work in visit. Complains that b/p has been elevated. This am at work was 150/90, took advil this am for chronic headache. Denies chest pain, dyspnea, orthopnea, hemoptysis, fever, n/v/d, edema, visual/speech changes, ext weakness, syncope. PT has had several reaidngs in past that b/p elevated. Headache is gone, but has 2-3 headaches a week despite being on topamax followed by Dr. Sandria Manly. She says she walks 3 miles /d most days, eats good diet but does not lose weight not fasting today.   August 04, 2010 --Presents for an acute office visit. Complains of sinus pressure/congestion, Post nasl drainage, productive cough with green mucus, increased SOB, chills/sweats onset yesterday  She is taking sudafed with some help. She feels run down, no energy. Missed work today.  Denies chest pain, orthopnea, hemoptysis, fever, n/v/d, edema, headache. She has not been here in >1 yr. is no longer taking b/p meds-Bystolic. Says b/p has been doing very well.    12/17/2011 Acute OV  Complains of sinsus pressure/congestion, sore throat, PND, prod cough with green mucus, bilateral ear discomfort, hoarseness x5days, worse x 1 day. Patient has tried over-the-counter medications for  cold and sinus without much relief. She denies any chest pain, shortness of breath, hemoptysis, or leg swelling. No recent antibiotics or travel.  Patient has not been seen for greater than a year. In office and has been recommended to have routine lab work as she has a history of hypertension. She declines at this time, has agreed to come back for a physical with Dr. Irving Shows in less than one month.    Review of Systems Constitutional:   No  weight loss, night sweats,   +Fevers, chills, fatigue, or  lassitude.  HEENT:   No headaches,  Difficulty swallowing,  Tooth/dental problems, or  Sore throat,                No sneezing, itching, ear ache,  +nasal congestion, post nasal drip,   CV:  No chest pain,  Orthopnea, PND, swelling in lower extremities, anasarca, dizziness, palpitations, syncope.   GI  No heartburn, indigestion, abdominal pain, nausea, vomiting, diarrhea, change in bowel habits, loss of appetite, bloody stools.   Resp:   No coughing up of blood.    No chest wall deformity  Skin: no rash or lesions.  GU: no dysuria, change in color of urine, no urgency or frequency.  No flank pain, no hematuria   MS:  No joint pain or swelling.  No decreased range of motion.  No back pain.  Psych:  No change in mood or affect. No depression or anxiety.  No memory loss.        Objective:   Physical Exam GEN: A/Ox3; pleasant , NAD, well nourished  HEENT:  [place]/AT,  EACs-clear, TMs-wnl, NOSE-clear drainage , max sinus press,  THROAT-clear, no lesions, no postnasal drip or exudate noted.   NECK:  Supple w/ fair ROM; no JVD; normal carotid impulses w/o bruits; no thyromegaly or nodules palpated; no lymphadenopathy.  RESP  Coarse BS  w/o, wheezes/ rales/ or rhonchi.no accessory muscle use, no dullness to percussion  CARD:  RRR, no m/r/g  , no peripheral edema, pulses intact, no cyanosis or clubbing.  GI:   Soft & nt; nml bowel sounds; no organomegaly or masses detected.  Musco: Warm  bil, no deformities or joint swelling noted.   Neuro: alert, no focal deficits noted.    Skin: Warm, no lesions or rashes         Assessment & Plan:

## 2011-12-17 NOTE — Patient Instructions (Signed)
Omnicef 300mg  Twice daily  For 10 days  Mucinex DM Twice daily  As needed  Cough/congestion  Hydromet 1-2 tsp every 4-6 hr As needed  Cough-may make you sleepy .  Saline nasal rinses As needed   Please contact office for sooner follow up if symptoms do not improve or worsen or seek emergency care  follow up Dr. Kriste Basque  In 1 month for physical.

## 2012-01-19 ENCOUNTER — Ambulatory Visit: Payer: 59 | Admitting: Pulmonary Disease

## 2012-03-11 ENCOUNTER — Ambulatory Visit: Payer: 59 | Admitting: Pulmonary Disease

## 2012-07-08 ENCOUNTER — Ambulatory Visit (INDEPENDENT_AMBULATORY_CARE_PROVIDER_SITE_OTHER): Payer: 59 | Admitting: Cardiovascular Disease

## 2012-07-08 ENCOUNTER — Encounter: Payer: Self-pay | Admitting: Cardiovascular Disease

## 2012-07-08 VITALS — BP 129/80 | HR 73 | Ht 61.0 in | Wt 166.0 lb

## 2012-07-08 DIAGNOSIS — R002 Palpitations: Secondary | ICD-10-CM

## 2012-07-08 LAB — BASIC METABOLIC PANEL
CO2: 29 mEq/L (ref 19–32)
Chloride: 104 mEq/L (ref 96–112)
Creatinine, Ser: 0.9 mg/dL (ref 0.4–1.2)
Glucose, Bld: 92 mg/dL (ref 70–99)

## 2012-07-08 NOTE — Progress Notes (Signed)
History of Present Illness: 36 yo [name] who is an employee in our office who is here today for evaluation of palpitations. She has a history of migraine headaches, left sided inferior cerebellar infarctions, nonepileptic seizures, former tobacco use, HLD. She has been seen in the past by Dr. Sandria Manly in Neurology. She was hospitalized in 2008 with bilateral cerebellar strokes. MRA was normal. Echo was normal with LVEF of 65%. She was placed on Lipitor at that time for hyperlipidemia. She has not been on a statin for last few years.   She tells me today that she has has been noticing her heart race with heavy beats in her chest and neck for last 4 weeks. This lasts for a few seconds. No dizziness, near syncope or syncope. No chest pain. This has been occurring every day. She does not use caffeine or smoke. She cannot think of anything that makes this happen.   Primary Care Physician: Rubye Oaks   Past Medical History  Diagnosis Date  . Herpes simplex infection   . Cigarette smoker   . Dyslipidemia   . History of syncope   . Migraine headache   . History of cerebrovascular accident     Past Surgical History  Procedure Date  . None     Current Outpatient Prescriptions  Medication Sig Dispense Refill  . aspirin 81 MG tablet Take 81 mg by mouth daily.      . Multiple Vitamin (MULTIVITAMIN) capsule Take 1 capsule by mouth daily.        No Known Allergies  History   Social History  . Marital Status: Married    Spouse Name: N/A    Number of Children: 2  . Years of Education: N/A   Occupational History  . 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 Smoking status: Former Smoker -- 1.0 packs/day for 6 years    Types: Cigarettes    Quit date: 09/28/2006  . Smokeless tobacco: Not on file  . Alcohol Use: No  . Drug Use: No  . Sexually Active: Not on file   Other Topics Concern  . Not on file   Social History Narrative  . No narrative on file    Family  History  Problem Relation Age of Onset  . Asthma Sister   . Clotting disorder Father   . Clotting disorder Paternal Uncle   . Rheum arthritis Maternal Grandmother   . Throat cancer Maternal Grandmother   . Diabetes Maternal Grandmother   . Heart attack Father 60  . Heart attack Paternal Uncle     7 uncles    Review of Systems:  As stated in the HPI and otherwise negative.   BP 129/80  Pulse 73  Ht 5\' 1"  (1.549 m)  Wt 166 lb (75.297 kg)  BMI 31.37 kg/m2  Physical Examination: General: Well developed, well nourished, NAD HEENT: OP clear, mucus membranes moist SKIN: warm, dry. No rashes. Neuro: No focal deficits Musculoskeletal: Muscle strength 5/5 all ext Psychiatric: Mood and affect normal Neck: No JVD, no carotid bruits, no thyromegaly, no lymphadenopathy. Lungs:Clear bilaterally, no wheezes, rhonci, crackles Cardiovascular: Regular rate and rhythm. No murmurs, gallops or rubs. Abdomen:Soft. Bowel sounds present. Non-tender.  Extremities: No lower extremity edema. Pulses are 2 + in the bilateral DP/PT.  EKG:  NSR, rate 73 bpm. Normal EKG  Assessment and Plan:   1. Palpitations:  Will get an echo to exclude structural heart disease and have her wear  a 48 hour monitor. Will check TSH and BMET. Likely Premature beats but need to exclude other arrythmias. She is instructed to avoid stimulants such as caffeine, nicotine.

## 2012-07-08 NOTE — Patient Instructions (Addendum)
Your physician recommends that you schedule a follow-up appointment  As needed with Dr. Clifton James  Your physician has requested that you have an echocardiogram. Echocardiography is a painless test that uses sound waves to create images of your heart. It provides your doctor with information about the size and shape of your heart and how well your heart's chambers and valves are working. This procedure takes approximately one hour. There are no restrictions for this procedure.   Your physician has recommended that you wear a holter monitor. Holter monitors are medical devices that record the heart's electrical activity. Doctors most often use these monitors to diagnose arrhythmias. Arrhythmias are problems with the speed or rhythm of the heartbeat. The monitor is a small, portable device. You can wear one while you do your normal daily activities. This is usually used to diagnose what is causing palpitations/syncope (passing out).

## 2012-07-11 ENCOUNTER — Ambulatory Visit (HOSPITAL_COMMUNITY): Payer: 59 | Attending: Internal Medicine | Admitting: Radiology

## 2012-07-11 DIAGNOSIS — I369 Nonrheumatic tricuspid valve disorder, unspecified: Secondary | ICD-10-CM | POA: Insufficient documentation

## 2012-07-11 DIAGNOSIS — I059 Rheumatic mitral valve disease, unspecified: Secondary | ICD-10-CM | POA: Insufficient documentation

## 2012-07-11 DIAGNOSIS — R002 Palpitations: Secondary | ICD-10-CM | POA: Insufficient documentation

## 2012-07-11 NOTE — Progress Notes (Signed)
Echocardiogram performed.  

## 2012-07-12 ENCOUNTER — Encounter (HOSPITAL_COMMUNITY): Payer: Self-pay | Admitting: *Deleted

## 2012-07-18 ENCOUNTER — Encounter: Payer: Self-pay | Admitting: Cardiovascular Disease

## 2012-07-18 ENCOUNTER — Encounter (INDEPENDENT_AMBULATORY_CARE_PROVIDER_SITE_OTHER): Payer: 59

## 2012-07-18 DIAGNOSIS — R002 Palpitations: Secondary | ICD-10-CM

## 2012-08-23 ENCOUNTER — Other Ambulatory Visit: Payer: Self-pay | Admitting: *Deleted

## 2012-08-23 DIAGNOSIS — R002 Palpitations: Secondary | ICD-10-CM

## 2012-11-12 ENCOUNTER — Other Ambulatory Visit: Payer: Self-pay

## 2013-08-03 ENCOUNTER — Other Ambulatory Visit: Payer: Self-pay

## 2013-12-06 ENCOUNTER — Telehealth: Payer: Self-pay | Admitting: Adult Health

## 2013-12-06 DIAGNOSIS — G43909 Migraine, unspecified, not intractable, without status migrainosus: Secondary | ICD-10-CM

## 2013-12-06 NOTE — Telephone Encounter (Signed)
Pt returned call

## 2013-12-06 NOTE — Telephone Encounter (Signed)
[id number] x1- pt has not been seen 2013

## 2013-12-06 NOTE — Telephone Encounter (Signed)
Per libby she wants this sent straight to SN. Please advise thanks

## 2013-12-11 NOTE — Telephone Encounter (Signed)
Per SN--  Ok to refer to neurology for hx of migraines, non epilepsy seizures.  thanks

## 2013-12-11 NOTE — Telephone Encounter (Signed)
Referral placed - [facility] [place].

## 2013-12-18 ENCOUNTER — Encounter: Payer: Self-pay | Admitting: Neurology

## 2013-12-18 ENCOUNTER — Ambulatory Visit (INDEPENDENT_AMBULATORY_CARE_PROVIDER_SITE_OTHER): Payer: 59 | Admitting: Neurology

## 2013-12-18 ENCOUNTER — Telehealth: Payer: Self-pay | Admitting: Neurology

## 2013-12-18 VITALS — BP 120/80 | HR 74 | Temp 98.1°F | Ht 61.0 in | Wt 165.0 lb

## 2013-12-18 DIAGNOSIS — G43909 Migraine, unspecified, not intractable, without status migrainosus: Secondary | ICD-10-CM

## 2013-12-18 MED ORDER — FROVATRIPTAN SUCCINATE 2.5 MG PO TABS: 2.5000 mg | ORAL_TABLET | ORAL | Status: DC | PRN

## 2013-12-18 MED ORDER — METOCLOPRAMIDE HCL 5 MG PO TABS: ORAL_TABLET | ORAL | Status: DC

## 2013-12-18 MED ORDER — TOPIRAMATE 25 MG PO TABS: ORAL_TABLET | ORAL | Status: DC

## 2013-12-18 MED ORDER — FLURBIPROFEN 50 MG PO TABS: ORAL_TABLET | ORAL | Status: DC

## 2013-12-18 NOTE — Telephone Encounter (Signed)
Spoke with pharmacy to verify Rx.

## 2013-12-18 NOTE — Telephone Encounter (Signed)
[facility] [phone], QUESTION ABOUT SCRIPT. / Sherri S.

## 2013-12-18 NOTE — Patient Instructions (Addendum)
1. Start Topamax 25mg  1 tab at bedtime for 3 days, then increase to 2 tabs at bedtime 2. Take Ansaid 50mg  twice a day on days of migraines, take with Reglan 5mg  for migraine-associated nausea/vomiting  3. Start daily magnesium 400mg  and riboflavin (B2) 400mg  supplements 4. Keep headache diary 5. Follow-up in 3 months

## 2013-12-18 NOTE — Progress Notes (Signed)
NEUROLOGY CONSULTATION NOTE  [name] MRN: [id number] DOB: 1975/10/04  Referring provider: Rubye Oaks, NP Primary care provider: Rubye Oaks, NP  Reason for consult:  Migraines  Thank you for your kind referral of [name] for consultation of the above symptoms. Although her history is well known to you, please allow me to reiterate it for the purpose of our medical record. Records and images were personally reviewed where available.   HISTORY OF PRESENT ILLNESS: This is a very pleasant 38 year old right-handed woman with a history of left cerebellar strokes in 2008, presenting to establish care for her migraines.  She started having migraines at age 28, with throbbing retro-orbital pain with associated nausea, vomiting, blurred vision, dizziness, photo and phonophobia.  She was initially having headaches every other week.  She saw a neurologist at [facility] and started on Topamax for headache prophylaxis, which significantly helped control her migraines.  She was lost to follow-up and ran out of Topamax 3-4 months ago.  Since then, she has had increase in migraine frequency, last week she had 2 migraine attacks in one week, with associated nausea, vomiting, and blurred vision.  Ibuprofen and Aleve did not help.  She denies any prior aura to the migraines.  No associated focal numbness/tingling/weakness or confusion.  Triggers include her menstrual period, hotdogs.  She reports sleep is fine, and not a trigger for headaches.  Her mother has migraines.  She denies any diplopia, dysarthria, dysphagia, neck/back pain, bowel or bladder dysfunction.  She was diagnosed with the left cerebellar strokes in 2008 after she presented with vision changes where she saw a squiggly line.  MRI brain at that time showed several foci of abnormal signal in the left cerebellum affecting primarily the lateral and inferior aspect of the hemisphere largely sparing the  white matter. A tiny similar lesion is seen in the right inferior cerebellum. The brainstem is unaffected. On diffusion imaging, these areas are inapparent, that is, they appear isointense with surrounding brain. However on post-infusion images all of these areas show abnormal enhancement.  She had a vascular, cardiac, and hypercoagulable workup that were unrevealing.  She continues on a daily baby aspirin.  In 2008 and 2009, she started having recurrent episodes of unresponsiveness, 5 episodes were captured with vEEG monitoring, with no associated EEG changes, indicating these were non-epileptic.  She reports no further spells for the past 1-1/2 years.  She denies any gaps in time, olfactory/gustatory hallucinations, rising epigastric sensation, myoclonic jerks.  There is no family history of seizures, no history of CNS infections, febrile convulsions, or significant traumatic brain injury.    PAST MEDICAL HISTORY: Past Medical History  Diagnosis Date  . Herpes simplex infection   . Cigarette smoker   . Dyslipidemia   . History of syncope   . Migraine headache   . History of cerebrovascular accident     PAST SURGICAL HISTORY: Past Surgical History  Procedure Laterality Date  . None      MEDICATIONS: Current Outpatient Prescriptions on File Prior to Visit  Medication Sig Dispense Refill  . aspirin 81 MG tablet Take 81 mg by mouth daily.      . Multiple Vitamin (MULTIVITAMIN) capsule Take 1 capsule by mouth daily.       No current facility-administered medications on file prior to visit.    ALLERGIES: Allergies  Allergen Reactions  . Meclizine Hcl     FAMILY HISTORY: Family History  Problem Relation Age of  Onset  . Asthma Sister   . Clotting disorder Father   . Clotting disorder Paternal Uncle   . Rheum arthritis Maternal Grandmother   . Throat cancer Maternal Grandmother   . Diabetes Maternal Grandmother   . Heart attack Father 8  . Heart attack Paternal Uncle     7  uncles    SOCIAL HISTORY: History   Social History  . Marital Status: Married    Spouse Name: N/A    Number of Children: 2  . Years of Education: N/A   Occupational History  . 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 Smoking status: Former Smoker -- 1.00 packs/day for 6 years    Types: Cigarettes    Quit date: 09/28/2006  . Smokeless tobacco: Not on file  . Alcohol Use: No  . Drug Use: No  . Sexual Activity: Not on file   Other Topics Concern  . Not on file   Social History Narrative  . No narrative on file    REVIEW OF SYSTEMS: Constitutional: No fevers, chills, or sweats, no generalized fatigue, change in appetite Eyes: No visual changes, double vision, eye pain Ear, nose and throat: No hearing loss, ear pain, nasal congestion, sore throat Cardiovascular: No chest pain, palpitations Respiratory:  No shortness of breath at rest or with exertion, wheezes GastrointestinaI: No nausea, vomiting, diarrhea, abdominal pain, fecal incontinence Genitourinary:  No dysuria, urinary retention or frequency Musculoskeletal:  No neck pain, back pain Integumentary: No rash, pruritus, skin lesions Neurological: as above Psychiatric: No depression, insomnia, anxiety Endocrine: No palpitations, fatigue, diaphoresis, mood swings, change in appetite, change in weight, increased thirst Hematologic/Lymphatic:  No anemia, purpura, petechiae. Allergic/Immunologic: no itchy/runny eyes, nasal congestion, recent allergic reactions, rashes  PHYSICAL EXAM: Filed Vitals:   12/18/13 0832  BP: 120/80  Pulse: 74  Temp: 98.1 F (36.7 C)   General: No acute distress Head:  Normocephalic/atraumatic Neck: supple, no paraspinal tenderness, full range of motion Back: No paraspinal tenderness Heart: regular rate and rhythm Lungs: Clear to auscultation bilaterally. Vascular: No carotid bruits. Skin/Extremities: No rash, no edema Neurological Exam: Mental status: alert and  oriented to person, place, and time, no dysarthria or aphasia, Fund of knowledge is appropriate.  Recent and remote memory are intact.  Attention and concentration are normal.    Able to name objects and repeat phrases. Cranial nerves: CN I: not tested CN II: pupils equal, round and reactive to light, visual fields intact, fundi unremarkable. CN III, IV, VI:  full range of motion, no nystagmus, no ptosis CN V: facial sensation intact CN VII: upper and lower face symmetric CN VIII: hearing intact CN IX, X: gag intact, uvula midline CN XI: sternocleidomastoid and trapezius muscles intact CN XII: tongue midline Bulk & Tone: normal, no fasciculations. Motor: 5/5 throughout with no pronator drift. Sensation: intact to light touch, cold, pin, vibration and joint position sense.  No extinction to double simultaneous stimulation.  Romberg test negative Deep Tendon Reflexes: +2 throughout, no ankle clonus Plantar responses: downgoing bilaterally Cerebellar: no incoordination on finger to nose, heel to shin. No dysdiadochokinesia Gait: narrow-based and steady, able to tandem walk adequately. Tremor: none  IMPRESSION: This is a pleasant 38 year old right-handed woman with a history of left cerebellar stroke in 2008, non-epileptic unresponsive episodes captured on vEEG in 2009 (last episode 1-1/2 yrs ago), and episodic migraines.  Topamax helped significantly for headache prophylaxis, however since she ran out of it 3-4 months  ago, migraine frequency has increased.  She will restart Topamax 50mg  qhs.  We discussed rescue medications, she will take prn Ansaid 50mg  BID during the migraine attacks, in addition to Reglan 5mg .  Due to prior stroke, triptans are contraindicated.  She will start daily magnesium and riboflavin supplements. She knows to minimize rescue medication use to 2-3 times a week to help avoid medication overuse headaches.  She will keep a headache calendar and will follow-up in 3  months.  Thank you for allowing me to participate in the care of this patient. Please do not hesitate to call for any questions or concerns.   Amanda DollyKaren Trinitee Miles, M.D.

## 2014-01-08 ENCOUNTER — Telehealth: Payer: Self-pay | Admitting: Neurology

## 2014-01-08 NOTE — Telephone Encounter (Signed)
Please call about possible increase in Topamax dose. CB# [phone] / Sherri S.

## 2014-01-09 ENCOUNTER — Encounter: Payer: Self-pay | Admitting: Neurology

## 2014-01-09 NOTE — Telephone Encounter (Signed)
Agree, ok to increase to Topamax 75mg  at bedtime.  Please send Rx for Topamax 25mg  tabs, take 3 at bedtime. Thanks

## 2014-01-09 NOTE — Telephone Encounter (Signed)
Patient states she is still have headaches since she last seen you she would like to consider increasing to one more 25mg  pill at night for a total of75mg  please advise

## 2014-01-09 NOTE — Telephone Encounter (Signed)
Patient notified

## 2014-01-25 ENCOUNTER — Encounter: Payer: Self-pay | Admitting: Neurology

## 2014-01-26 ENCOUNTER — Telehealth: Payer: Self-pay | Admitting: Neurology

## 2014-01-26 DIAGNOSIS — G43909 Migraine, unspecified, not intractable, without status migrainosus: Secondary | ICD-10-CM

## 2014-01-26 MED ORDER — TOPIRAMATE 25 MG PO TABS: ORAL_TABLET | ORAL | Status: DC

## 2014-01-26 NOTE — Telephone Encounter (Signed)
Spoke to patient, she has had migraine now for 3 days.  She does feel that the increased Topamax dose is helping, but now has this current headache.  Instructed to increase Topamax to 25mg  in AM, 75mg  in PM.  Discussed trial of Tramadol 50mg  prn for headache (she cannot take triptan due to prior stroke), versus steroid taper. She would like to try Ibuprofen 800mg  first and increase Topamax.  She knows to call for further problems.

## 2014-02-02 ENCOUNTER — Telehealth: Payer: Self-pay | Admitting: Neurology

## 2014-02-02 NOTE — Telephone Encounter (Signed)
Pt states that we changed her medication to a higher dose and now she cant get a refilled please call [phone]

## 2014-02-02 NOTE — Telephone Encounter (Signed)
Patient needed Rx updated with pharmacy. Pharmacy called

## 2014-03-23 ENCOUNTER — Encounter: Payer: Self-pay | Admitting: Neurology

## 2014-03-23 NOTE — Telephone Encounter (Signed)
Called and offered patient course of steroids, but she declined.  I instructed her to increase topamax to 50mg  in the morning and continue 75mg  at bedtime.  She can also try taking Aleve with benadryl 25mg  for rescue medication.  No relief with Ansaid or reglan.    Donika K. Allena KatzPatel, DO

## 2014-07-20 ENCOUNTER — Ambulatory Visit (INDEPENDENT_AMBULATORY_CARE_PROVIDER_SITE_OTHER): Payer: 59 | Admitting: Neurology

## 2014-07-20 ENCOUNTER — Encounter: Payer: Self-pay | Admitting: Neurology

## 2014-07-20 VITALS — BP 110/76 | HR 98 | Resp 16 | Ht 61.0 in | Wt 145.0 lb

## 2014-07-20 DIAGNOSIS — G43009 Migraine without aura, not intractable, without status migrainosus: Secondary | ICD-10-CM

## 2014-07-20 MED ORDER — CAMBIA 50 MG PO PACK: PACK | ORAL | Status: DC

## 2014-07-20 MED ORDER — TOPIRAMATE 25 MG PO TABS: ORAL_TABLET | ORAL | Status: DC

## 2014-07-20 NOTE — Progress Notes (Signed)
NEUROLOGY FOLLOW UP OFFICE NOTE  Debraann Livingstone Lewey [id number]  HISTORY OF PRESENT ILLNESS: I had the pleasure of seeing Jenette Rayson in follow-up in the neurology clinic on 07/20/2014.  The patient was last seen 7 months ago for migraines, at which time Topamax was restarted.  She reports doing very well with no major migraines except for last weekend when she woke up with a headache that lasted for 2 days. Ansaid did not help. There was some photophobia, no nausea or vomiting. She was able to go to work. She denies any side effects on Topamax 50mg  in AM, 75mg  in PM.  Sleep is good. She infrequently needs to take Reglan, which also helps.  She denies any focal numbness/tingling/weakness, dizziness, neck/back pain.  HPI:  This is a very pleasant 38 yo RH woman with a history of left cerebellar strokes in 2008 and migraines without aura. She started having migraines at age 62, with throbbing retro-orbital pain with associated nausea, vomiting, blurred vision, dizziness, photo and phonophobia. She was initially having headaches every other week. She saw a neurologist at [facility] and started on Topamax for headache prophylaxis, which significantly helped control her migraines. She was lost to follow-up and ran out of Topamax with an increase in migraine frequency. She denies any prior aura to the migraines. No associated focal numbness/tingling/weakness or confusion. Triggers include her menstrual period, hotdogs. She reports sleep is fine, and not a trigger for headaches. Her mother has migraines.   She was diagnosed with the left cerebellar strokes in 2008 after she presented with vision changes where she saw a squiggly line. MRI brain at that time showed several foci of abnormal signal in the left cerebellum affecting primarily the lateral and inferior aspect of the hemisphere largely sparing the white matter. A tiny similar lesion is seen in the right inferior cerebellum.  The brainstem is unaffected. On diffusion imaging, these areas are inapparent, that is, they appear isointense with surrounding brain. However on post-infusion images all of these areas show abnormal enhancement. She had a vascular, cardiac, and hypercoagulable workup that were unrevealing. She continues on a daily baby aspirin.   In 2008 and 2009, she started having recurrent episodes of unresponsiveness, 5 episodes were captured with vEEG monitoring, with no associated EEG changes, indicating these were non-epileptic. She reports no further spells since 2013  PAST MEDICAL HISTORY: Past Medical History  Diagnosis Date  . Herpes simplex infection   . Cigarette smoker   . Dyslipidemia   . History of syncope   . Migraine headache   . History of cerebrovascular accident     MEDICATIONS: Current Outpatient Prescriptions on File Prior to Visit  Medication Sig Dispense Refill  . metoCLOPramide (REGLAN) 5 MG tablet Take every 8 hours as needed for migraine-associated nausea and vomiting  15 tablet  3  . Multiple Vitamin (MULTIVITAMIN) capsule Take 1 capsule by mouth daily.      Topamax 25mg : Take 2 tabs in AM, 3 tabs in PM No current facility-administered medications on file prior to visit.    ALLERGIES: Allergies  Allergen Reactions  . Meclizine Hcl     FAMILY HISTORY: Family History  Problem Relation Age of Onset  . Asthma Sister   . Clotting disorder Father   . Clotting disorder Paternal Uncle   . Rheum arthritis Maternal Grandmother   . Throat cancer Maternal Grandmother   . Diabetes Maternal Grandmother   . Heart attack Father 49  . Heart attack  Paternal Uncle     7 uncles    SOCIAL HISTORY: History   Social History  . Marital Status: Married    Spouse Name: N/A    Number of Children: 2  . Years of Education: N/A   Occupational History  . 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 Smoking status: Former Smoker -- 1.00 packs/day for 6 years     Types: Cigarettes    Quit date: 09/28/2006  . Smokeless tobacco: Not on file  . Alcohol Use: No  . Drug Use: No  . Sexual Activity: Not on file   Other Topics Concern  . Not on file   Social History Narrative  . No narrative on file    REVIEW OF SYSTEMS: Constitutional: No fevers, chills, or sweats, no generalized fatigue, change in appetite Eyes: No visual changes, double vision, eye pain Ear, nose and throat: No hearing loss, ear pain, nasal congestion, sore throat Cardiovascular: No chest pain, palpitations Respiratory:  No shortness of breath at rest or with exertion, wheezes GastrointestinaI: No nausea, vomiting, diarrhea, abdominal pain, fecal incontinence Genitourinary:  No dysuria, urinary retention or frequency Musculoskeletal:  No neck pain, back pain Integumentary: No rash, pruritus, skin lesions Neurological: as above Psychiatric: No depression, insomnia, anxiety Endocrine: No palpitations, fatigue, diaphoresis, mood swings, change in appetite, change in weight, increased thirst Hematologic/Lymphatic:  No anemia, purpura, petechiae. Allergic/Immunologic: no itchy/runny eyes, nasal congestion, recent allergic reactions, rashes  PHYSICAL EXAM: Filed Vitals:   07/20/14 1322  BP: 110/76  Pulse: 98  Resp: 16   General: No acute distress Head:  Normocephalic/atraumatic Neck: supple, no paraspinal tenderness, full range of motion Heart:  Regular rate and rhythm Lungs:  Clear to auscultation bilaterally Back: No paraspinal tenderness Skin/Extremities: No rash, no edema Neurological Exam: alert and oriented to person, place, and time. No aphasia or dysarthria. Fund of knowledge is appropriate.  Recent and remote memory are intact.  Attention and concentration are normal.    Able to name objects and repeat phrases. Cranial nerves: Pupils equal, round, reactive to light.  Fundoscopic exam unremarkable, no papilledema. Extraocular movements intact with no nystagmus. Visual  fields full. Facial sensation intact. No facial asymmetry. Tongue, uvula, palate midline.  Motor: Bulk and tone normal, muscle strength 5/5 throughout with no pronator drift.  Sensation to light touch intact.  No extinction to double simultaneous stimulation.  Deep tendon reflexes 2+ throughout, toes downgoing.  Finger to nose testing intact.  Gait narrow-based and steady, able to tandem walk adequately.  Romberg negative.  IMPRESSION: This is a pleasant 38 yo RH woman with a history of left cerebellar stroke in 2008, non-epileptic unresponsive episodes captured on vEEG in 2009 (last episode in 2013), and episodic migraines. Topamax helped significantly for headache prophylaxis, continue 50mg  in AM, 75mg  in PM. She has no plans for pregnancy at this time, we discussed risks for fetal malformations on Topamax.  She will try Cambia for rescue, triptans contraindicated due to prior stroke.She will follow-up in 6 months and knows to call our office for any problems.  Thank you for allowing me to participate in her care.  Please do not hesitate to call for any questions or concerns.  The duration of this appointment visit was 15 minutes of face-to-face time with the patient.  Greater than 50% of this time was spent in counseling, explanation of diagnosis, planning of further management, and coordination of care.   Patrcia DollyKaren Aquino, M.D.  CC: Dr. Clent RidgesParrett

## 2014-07-20 NOTE — Patient Instructions (Signed)
1. Continue Topamax 25mg : Take 2 tabs in AM, 3 tabs in PM 2. Take Cambia 1 packet as needed at onset of headache, do not use more than 3 times a week 3. Follow-up in 6 months, call for any problems

## 2014-07-21 ENCOUNTER — Encounter: Payer: Self-pay | Admitting: Neurology

## 2014-07-21 DIAGNOSIS — G43009 Migraine without aura, not intractable, without status migrainosus: Secondary | ICD-10-CM | POA: Insufficient documentation

## 2015-01-25 ENCOUNTER — Ambulatory Visit: Payer: Self-pay | Admitting: Neurology

## 2015-01-28 ENCOUNTER — Encounter: Payer: Self-pay | Admitting: Neurology

## 2015-01-28 DIAGNOSIS — Z029 Encounter for administrative examinations, unspecified: Secondary | ICD-10-CM

## 2015-09-24 ENCOUNTER — Telehealth: Payer: Self-pay | Admitting: Neurology

## 2015-09-24 DIAGNOSIS — G43009 Migraine without aura, not intractable, without status migrainosus: Secondary | ICD-10-CM

## 2015-09-24 MED ORDER — TOPIRAMATE 25 MG PO TABS: ORAL_TABLET | ORAL | Status: DC

## 2015-09-24 NOTE — Telephone Encounter (Signed)
Rx sent to pharmacy   

## 2015-09-24 NOTE — Telephone Encounter (Signed)
PT called and scheduled appointment with Dr Karel JarvisAquino on 11/29/2015 (soonest) but she will need a refill of her Topamax/Dawn CB# [phone]

## 2015-10-10 MED FILL — TOPIRAMATE 25 MG TABLET: 25 | 90 days supply | Qty: 450 | Fill #0

## 2015-10-28 DIAGNOSIS — H5203 Hypermetropia, bilateral: Secondary | ICD-10-CM | POA: Diagnosis not present

## 2015-11-29 ENCOUNTER — Ambulatory Visit (INDEPENDENT_AMBULATORY_CARE_PROVIDER_SITE_OTHER): Payer: 59 | Admitting: Neurology

## 2015-11-29 ENCOUNTER — Encounter: Payer: Self-pay | Admitting: Neurology

## 2015-11-29 VITALS — BP 106/70 | HR 82 | Ht 61.0 in | Wt 154.0 lb

## 2015-11-29 DIAGNOSIS — G43009 Migraine without aura, not intractable, without status migrainosus: Secondary | ICD-10-CM

## 2015-11-29 MED ORDER — DICLOFENAC SODIUM 50 MG PO TBEC: DELAYED_RELEASE_TABLET | ORAL | Status: DC

## 2015-11-29 MED ORDER — TOPIRAMATE 100 MG PO TABS: 100.0000 mg | ORAL_TABLET | Freq: Two times a day (BID) | ORAL | Status: DC

## 2015-11-29 MED FILL — TOPIRAMATE 100 MG TABLET: 100 | 90 days supply | Qty: 180 | Fill #0

## 2015-11-29 MED FILL — DICLOFENAC SOD EC 50 MG TAB: 50 | 30 days supply | Qty: 10 | Fill #0

## 2015-11-29 NOTE — Progress Notes (Signed)
NEUROLOGY FOLLOW UP OFFICE NOTE  [name] [id number]  HISTORY OF PRESENT ILLNESS: I had the pleasure of seeing [name] in follow-up in the neurology clinic on 11/29/2015.  The patient was last seen 5 months ago for migraines. She reported a good response to Topamax. She is currently taking  in AM,  in PM. Ansaid did not help, Cambia was too expensive. She is unable to take triptans due to history of stroke. She reports migraines are not as much, but still has them once a week, lasting all day. Last migraine was 2 weeks ago. There is associated nausea, she occasionally takes Reglan. She denies any side effects on Topamax. She denies any clear migraine triggers, but has been under a lot of stress, her father passed away last year. She reports vision is good, she denies any focal numbness/tingling/weakness, dizziness, neck/back pain. She stopped taking aspirin.   HPI:  This is a very pleasant 40 yo RH woman with a history of left cerebellar strokes in 2008 and migraines without aura. She started having migraines at age 38, with throbbing retro-orbital pain with associated nausea, vomiting, blurred vision, dizziness, photo and phonophobia. She was initially having headaches every other week. She saw a neurologist at [facility] and started on Topamax for headache prophylaxis, which significantly helped control her migraines. She was lost to follow-up and ran out of Topamax with an increase in migraine frequency. She denies any prior aura to the migraines. No associated focal numbness/tingling/weakness or confusion. Triggers include her menstrual period, hotdogs. She reports sleep is fine, and not a trigger for headaches. Her mother has migraines.   She was diagnosed with the left cerebellar strokes in 2008 after she presented with vision changes where she saw a squiggly line. MRI brain at that time showed several foci of abnormal signal in the left cerebellum  affecting primarily the lateral and inferior aspect of the hemisphere largely sparing the white matter. A tiny similar lesion is seen in the right inferior cerebellum. The brainstem is unaffected. On diffusion imaging, these areas are inapparent, that is, they appear isointense with surrounding brain. However on post-infusion images all of these areas show abnormal enhancement. She had a vascular, cardiac, and hypercoagulable workup that were unrevealing. She continues on a daily baby aspirin.   In 2008 and 2009, she started having recurrent episodes of unresponsiveness, 5 episodes were captured with vEEG monitoring, with no associated EEG changes, indicating these were non-epileptic. She reports no further spells since 2013  PAST MEDICAL HISTORY: Past Medical History  Diagnosis Date  . Herpes simplex infection   . Cigarette smoker   . Dyslipidemia   . History of syncope   . Migraine headache   . History of cerebrovascular accident     MEDICATIONS: Current Outpatient Prescriptions on File Prior to Visit  Medication Sig Dispense Refill  . metoCLOPramide (REGLAN) 5 MG tablet Take every 8 hours as needed for migraine-associated nausea and vomiting  15 tablet  3  . Multiple Vitamin (MULTIVITAMIN) capsule Take 1 capsule by mouth daily.      Topamax : Take 2 tabs in AM, 3 tabs in PM No current facility-administered medications on file prior to visit.    ALLERGIES: Allergies  Allergen Reactions  . Meclizine Hcl     FAMILY HISTORY: Family History  Problem Relation Age of Onset  . Asthma Sister   . Clotting disorder Father   . Clotting disorder Paternal Uncle   . Rheum arthritis  Maternal Grandmother   . Throat cancer Maternal Grandmother   . Diabetes Maternal Grandmother   . Heart attack Father [id number]  . Heart attack Paternal Uncle     7 uncles    SOCIAL HISTORY: Social History   Social History  . Marital Status: Married    Spouse Name: N/A  . Number of Children: 2  . Years  of Education: N/A   Occupational History  . 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 Smoking status: Former Smoker -- 1.00 packs/day for 6 years    Types: Cigarettes    Quit date: 09/28/2006  . Smokeless tobacco: Not on file  . Alcohol Use: No  . Drug Use: No  . Sexual Activity: Not on file   Other Topics Concern  . Not on file   Social History Narrative    REVIEW OF SYSTEMS: Constitutional: No fevers, chills, or sweats, no generalized fatigue, change in appetite Eyes: No visual changes, double vision, eye pain Ear, nose and throat: No hearing loss, ear pain, nasal congestion, sore throat Cardiovascular: No chest pain, palpitations Respiratory:  No shortness of breath at rest or with exertion, wheezes GastrointestinaI: No nausea, vomiting, diarrhea, abdominal pain, fecal incontinence Genitourinary:  No dysuria, urinary retention or frequency Musculoskeletal:  No neck pain, back pain Integumentary: No rash, pruritus, skin lesions Neurological: as above Psychiatric: No depression, insomnia, anxiety Endocrine: No palpitations, fatigue, diaphoresis, mood swings, change in appetite, change in weight, increased thirst Hematologic/Lymphatic:  No anemia, purpura, petechiae. Allergic/Immunologic: no itchy/runny eyes, nasal congestion, recent allergic reactions, rashes  PHYSICAL EXAM: Filed Vitals:   11/29/15 1111  BP: 106/70  Pulse: 82   General: No acute distress Head:  Normocephalic/atraumatic Neck: supple, no paraspinal tenderness, full range of motion Heart:  Regular rate and rhythm Lungs:  Clear to auscultation bilaterally Back: No paraspinal tenderness Skin/Extremities: No rash, no edema Neurological Exam: alert and oriented to person, place, and time. No aphasia or dysarthria. Fund of knowledge is appropriate.  Recent and remote memory are intact. 3/3 delayed recall. Attention and concentration are normal.    Able to name objects and repeat phrases.  Cranial nerves: Pupils equal, round, reactive to light.  Fundoscopic exam unremarkable, no papilledema. Extraocular movements intact with no nystagmus. Visual fields full. Facial sensation intact. No facial asymmetry. Tongue, uvula, palate midline.  Motor: Bulk and tone normal, muscle strength 5/5 throughout with no pronator drift.  Sensation to light touch intact.  No extinction to double simultaneous stimulation.  Deep tendon reflexes 2+ throughout, toes downgoing.  Finger to nose testing intact.  Gait narrow-based and steady, able to tandem walk adequately.  Romberg negative.  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 Topamax helped significantly for headache prophylaxis, however she reports migraines once a week now. She will increase dose to 100mg  BID. She has no plans for pregnancy at this time, we had previously discussed risks for fetal malformations on Topamax.  Cambia was cost-prohibitive, she will try Diclofenac for migraine rescue. We also discussed restarting a daily baby aspirin for secondary stroke prevention, she continues to follow-up with her PCP for control of vascular risk factors. She will keep a calendar of her migraines and follow-up in 6 months. She knows to call our office for any problems.  Thank you for allowing me to participate  in her care.  Please do not hesitate to call for any questions or concerns.  The duration of this appointment visit was 24 minutes of face-to-face time with the patient.  Greater than 50% of this time was spent in counseling, explanation of diagnosis, planning of further management, and coordination of care.   Patrcia Dolly, M.D.   CC: Tammy Parrett

## 2015-11-29 NOTE — Patient Instructions (Signed)
1. Increase Topamax 100mg  twice a day 2. Restart aspirin 81mg  daily 3. Try Diclofenac 50mg  at onset of migraine. Do not take more than 2-3 a week to avoid rebound headaches 4. Keep a calendar of your migraines 5. Follow-up in 6 months, call for any changes

## 2016-07-13 ENCOUNTER — Other Ambulatory Visit: Payer: Self-pay | Admitting: Neurology

## 2016-07-13 ENCOUNTER — Encounter: Payer: Self-pay | Admitting: Neurology

## 2016-07-13 MED ORDER — IBUPROFEN 800 MG PO TABS: ORAL_TABLET | ORAL | 6 refills | Status: DC

## 2016-07-14 MED FILL — IBUPROFEN 800 MG TABLET: 800 | 7 days supply | Qty: 10 | Fill #0

## 2016-07-17 ENCOUNTER — Ambulatory Visit (INDEPENDENT_AMBULATORY_CARE_PROVIDER_SITE_OTHER): Payer: 59 | Admitting: Neurology

## 2016-07-17 ENCOUNTER — Encounter: Payer: Self-pay | Admitting: Neurology

## 2016-07-17 VITALS — BP 116/72 | HR 114 | Wt 151.5 lb

## 2016-07-17 DIAGNOSIS — G43009 Migraine without aura, not intractable, without status migrainosus: Secondary | ICD-10-CM

## 2016-07-17 MED ORDER — TOPIRAMATE 100 MG PO TABS: ORAL_TABLET | ORAL | 3 refills | Status: DC

## 2016-07-17 NOTE — Progress Notes (Signed)
NEUROLOGY FOLLOW UP OFFICE NOTE  Amanda SmilingKimberly D Miles [id number]  HISTORY OF PRESENT ILLNESS: I had the pleasure of seeing [name] in follow-up in the neurology clinic on 07/17/2016.  The patient was last seen 7 months ago for migraines. She had a good initial response to Topamax, currently on 100mg  BID without side effects. She however continues to have 2-3 migraines a month, lasting 2-3 days. She has one right now. She sent a message asking if she could try Ibuprofen 800mg  prn, and has noticed it helped ease off the pain. Sleep is good except when she occasionally wakes up with a migraine.  She reports vision is good, she denies any focal numbness/tingling/weakness, dizziness, neck/back pain.   HPI:  This is a very pleasant 40 yo RH woman with a history of left cerebellar strokes in 2008 and migraines without aura. She started having migraines at age 40, with throbbing retro-orbital pain with associated nausea, vomiting, blurred vision, dizziness, photo and phonophobia. She was initially having headaches every other week. She saw a neurologist at the [facility] and started on Topamax for headache prophylaxis, which significantly helped control her migraines. She was lost to follow-up and ran out of Topamax with an increase in migraine frequency. She denies any prior aura to the migraines. No associated focal numbness/tingling/weakness or confusion. Triggers include her menstrual period, hotdogs. She reports sleep is fine, and not a trigger for headaches. Her mother has migraines.   She was diagnosed with the left cerebellar strokes in 2008 after she presented with vision changes where she saw a squiggly line. MRI brain at that time showed several foci of abnormal signal in the left cerebellum affecting primarily the lateral and inferior aspect of the hemisphere largely sparing the white matter. A tiny similar lesion is seen in the right inferior cerebellum. The brainstem is  unaffected. On diffusion imaging, these areas are inapparent, that is, they appear isointense with surrounding brain. However on post-infusion images all of these areas show abnormal enhancement. She had a vascular, cardiac, and hypercoagulable workup that were unrevealing. She continues on a daily baby aspirin.   In 2008 and 2009, she started having recurrent episodes of unresponsiveness, 5 episodes were captured with vEEG monitoring, with no associated EEG changes, indicating these were non-epileptic. She reports no further spells since 2013  PAST MEDICAL HISTORY: Past Medical History:  Diagnosis Date  . Cigarette smoker   . Dyslipidemia   . Herpes simplex infection   . History of cerebrovascular accident   . History of syncope   . Migraine headache     MEDICATIONS:  Outpatient Encounter Prescriptions as of 07/17/2016  Medication Sig  . aspirin EC 81 MG tablet Take 81 mg by mouth daily.  Marland Kitchen. CAMBIA 50 MG PACK Take 1 as needed at onset of headache. Do not take more than 3 a week  . diclofenac (VOLTAREN) 50 MG EC tablet Take 1 tablet at onset of migraine. Do not take more than 2-3 a week  . ibuprofen (ADVIL,MOTRIN) 800 MG tablet Take 1 tablet for severe headache. Do not take more than 2-3 a week  . metoCLOPramide (REGLAN) 5 MG tablet Take every 8 hours as needed for migraine-associated nausea and vomiting  . Multiple Vitamin (MULTIVITAMIN) capsule Take 1 capsule by mouth daily.  Marland Kitchen. topiramate (TOPAMAX) 100 MG tablet Take 1 tablets twice a day  .     No facility-administered encounter medications on file as of 07/17/2016.    ALLERGIES: Allergies  Allergen Reactions  . Meclizine Hcl     FAMILY HISTORY: Family History  Problem Relation Age of Onset  . Asthma Sister   . Clotting disorder Father   . Clotting disorder Paternal Uncle   . Rheum arthritis Maternal Grandmother   . Throat cancer Maternal Grandmother   . Diabetes Maternal Grandmother   . Heart attack Father 24  . Heart  attack Paternal Uncle     7 uncles    SOCIAL HISTORY: Social History   Social History  . Marital status: Married    Spouse name: N/A  . Number of children: 2  . Years of education: N/A   Occupational History  . Medical Records at [employer]    Social History Main Topics  . Smoking status: Former Smoker    Packs/day: 1.00    Years: 6.00    Types: Cigarettes    Quit date: 09/28/2006  . Smokeless tobacco: Not on file  . Alcohol use No  . Drug use: No  . Sexual activity: Not on file   Other Topics Concern  . Not on file   Social History Narrative  . No narrative on file    REVIEW OF SYSTEMS: Constitutional: No fevers, chills, or sweats, no generalized fatigue, change in appetite Eyes: No visual changes, double vision, eye pain Ear, nose and throat: No hearing loss, ear pain, nasal congestion, sore throat Cardiovascular: No chest pain, palpitations Respiratory:  No shortness of breath at rest or with exertion, wheezes GastrointestinaI: No nausea, vomiting, diarrhea, abdominal pain, fecal incontinence Genitourinary:  No dysuria, urinary retention or frequency Musculoskeletal:  No neck pain, back pain Integumentary: No rash, pruritus, skin lesions Neurological: as above Psychiatric: No depression, insomnia, anxiety Endocrine: No palpitations, fatigue, diaphoresis, mood swings, change in appetite, change in weight, increased thirst Hematologic/Lymphatic:  No anemia, purpura, petechiae. Allergic/Immunologic: no itchy/runny eyes, nasal congestion, recent allergic reactions, rashes  PHYSICAL EXAM: Vitals:   07/17/16 1555  BP: 116/72  Pulse: (!) 114   General: No acute distress Head:  Normocephalic/atraumatic Neck: supple, no paraspinal tenderness, full range of motion Heart:  Regular rate and rhythm Lungs:  Clear to auscultation bilaterally Back: No paraspinal tenderness Skin/Extremities: No rash, no edema Neurological Exam: alert and oriented to person, place, and  time. No aphasia or dysarthria. Fund of knowledge is appropriate.  Recent and remote memory are intact. 3/3 delayed recall. Attention and concentration are normal.    Able to name objects and repeat phrases. Cranial nerves: Pupils equal, round, reactive to light.  Fundoscopic exam unremarkable, no papilledema. Extraocular movements intact with no nystagmus. Visual fields full. Facial sensation intact. No facial asymmetry. Tongue, uvula, palate midline.  Motor: Bulk and tone normal, muscle strength 5/5 throughout with no pronator drift.  Sensation to light touch intact.  No extinction to double simultaneous stimulation.  Deep tendon reflexes 2+ throughout, toes downgoing.  Finger to nose testing intact.  Gait narrow-based and steady, able to tandem walk adequately.  Romberg negative.  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 Topamax helped significantly for headache prophylaxis, however she continues to have 2-3 migraines a month lasting 2-3 days. She will increase dose further to 150mg  BID x 1 week, then 200mg  BID. Continue to monitor for any side effects. No current pregnancy plans. She has prn Ibuprofen 800mg  for rescue, and knows to minimize  to 2-3 a week to avoid rebound headaches. She is taking a daily baby aspirin for secondary stroke prevention, she continues to follow-up with her PCP for control of vascular risk factors. She will keep a calendar of her migraines and follow-up in 4 months. She knows to call our office for any problems.  Thank you for allowing me to participate in her care.  Please do not hesitate to call for any questions or concerns.  The duration of this appointment visit was 25 minutes of face-to-face time with the patient.  Greater than 50% of this time was spent in counseling, explanation of diagnosis, planning of  further management, and coordination of care.   Patrcia Dolly, M.D.   CC: Rubye Oaks, NP

## 2016-07-17 NOTE — Patient Instructions (Signed)
1. Increase Topamax 100mg : Take 1.5 tablets twice a day for 2 weeks, then increase to 2 tablets twice a day 2. Take Ibuprofen 800mg  as needed for migraines, do not take more than 2-3 week to avoid rebound headaches 3. Keep a headache calendar, follow-up in 4 months

## 2016-07-20 MED FILL — TOPIRAMATE 100 MG TABLET: 100 | 90 days supply | Qty: 360 | Fill #0

## 2016-10-23 ENCOUNTER — Ambulatory Visit: Payer: Self-pay | Admitting: Neurology

## 2016-10-26 ENCOUNTER — Encounter: Payer: Self-pay | Admitting: Adult Health

## 2016-10-26 ENCOUNTER — Telehealth: Payer: Self-pay | Admitting: Adult Health

## 2016-10-26 ENCOUNTER — Encounter: Payer: Self-pay | Admitting: Neurology

## 2016-10-26 ENCOUNTER — Ambulatory Visit (INDEPENDENT_AMBULATORY_CARE_PROVIDER_SITE_OTHER): Payer: 59 | Admitting: Adult Health

## 2016-10-26 DIAGNOSIS — J069 Acute upper respiratory infection, unspecified: Secondary | ICD-10-CM

## 2016-10-26 MED ORDER — AZITHROMYCIN 250 MG PO TABS: ORAL_TABLET | ORAL | 0 refills | Status: AC

## 2016-10-26 MED FILL — AZITHROMYCIN 250 MG TABLET: 250 | 5 days supply | Qty: 6 | Fill #0

## 2016-10-26 NOTE — Assessment & Plan Note (Signed)
URI -suspect is viral w/ OM  Supportive care   Plan Patient Instructions  Zpack take as directed.  Mucinex DM Twice daily  As needed  Cough /congestion  Zyrtec 10mg . At bedtime  As needed  Drainage  Fluids and rest  Follow up with Primary Care to establish as discussed.  Please contact office for sooner follow up if symptoms do not improve or worsen or seek emergency care

## 2016-10-26 NOTE — Telephone Encounter (Signed)
[place] okay[place] to come in today.  She was put on schedule at 2:45 pm today.  May close this note, nothing further needed.

## 2016-10-26 NOTE — Progress Notes (Signed)
 @Patient  ID: [name], female    DOB: [birth year], 41 y.o.   MRN: [id number]  Chief Complaint  Patient presents with  . Acute Visit    cough    Referring provider: Julio SicksParrett, Jossie Smoot S, NP  HPI: 41 yo female former pt of Dr. Kriste BasqueNadel  With history of Migraines (f/by Neuro) , left Cerebellar strokes in 2008   10/26/2016 Acute OV :  Pt presents for an acute office visit. Complains of 3 days of cough, minimally productive with  hoarseness, nasal congestion, ear pain ,  No fever . + body aches and chills . Husband is sick with similar sx.  Taking theraflu. Without much help .  No chest pain, n/vd..  Last seen in 2013.   Allergies  Allergen Reactions  . Meclizine Hcl     Immunization History  Administered Date(s) Administered  . Influenza Whole 07/28/2010, 06/29/2011  . Influenza,inj,Quad PF,36+ Mos 06/28/2014  . Influenza-Unspecified 06/26/2016    Past Medical History:  Diagnosis Date  . Cigarette smoker   . Dyslipidemia   . Herpes simplex infection   . History of cerebrovascular accident   . History of syncope   . Migraine headache     Tobacco History: History  Smoking Status  . Former Smoker  . Packs/day: 1.00  . Years: 6.00  . Types: Cigarettes  . Quit date: 09/28/2006  Smokeless Tobacco  . Not on file   Counseling given: Not Answered   Outpatient Encounter Prescriptions as of 10/26/2016  Medication Sig  . aspirin EC 81 MG tablet Take 81 mg by mouth daily.  Marland Kitchen. ibuprofen (ADVIL,MOTRIN) 800 MG tablet Take 1 tablet for severe headache. Do not take more than 2-3 a week  . topiramate (TOPAMAX) 100 MG tablet Take 2 tablets twice a day  . azithromycin (ZITHROMAX Z-PAK) 250 MG tablet Take 2 tablets (500 mg) on  Day 1,  followed by 1 tablet (250 mg) once daily on Days 2 through 5.  . CAMBIA 50 MG PACK Take 1 as needed at onset of headache. Do not take more than 3 a week (Patient not taking: Reported on 10/26/2016)  . diclofenac (VOLTAREN) 50 MG EC tablet Take 1  tablet at onset of migraine. Do not take more than 2-3 a week (Patient not taking: Reported on 10/26/2016)  . metoCLOPramide (REGLAN) 5 MG tablet Take every 8 hours as needed for migraine-associated nausea and vomiting (Patient not taking: Reported on 10/26/2016)  . Multiple Vitamin (MULTIVITAMIN) capsule Take 1 capsule by mouth daily.   No facility-administered encounter medications on file as of 10/26/2016.      Review of Systems  Constitutional:   No  weight loss, night sweats,  Fevers,  +chills, fatigue, or  lassitude.  HEENT:   No headaches,  Difficulty swallowing,  Tooth/dental problems, or  Sore throat,                No sneezing, itching,  +ear ache, nasal congestion, post nasal drip,   CV:  No chest pain,  Orthopnea, PND, swelling in lower extremities, anasarca, dizziness, palpitations, syncope.   GI  No heartburn, indigestion, abdominal pain, nausea, vomiting, diarrhea, change in bowel habits, loss of appetite, bloody stools.    No coughing up of blood.  No change in color of mucus.  No wheezing.  No chest wall deformity  Skin: no rash or lesions.  GU: no dysuria, change in color of urine, no urgency or frequency.  No flank pain, no hematuria  MS:  No joint pain or swelling.  No decreased range of motion.  No back pain.    Physical Exam  BP 118/70   Pulse 90   Temp 97.4 F (36.3 C) (Oral)   Ht 5\' 1"  (1.549 m)   Wt 143 lb (64.9 kg)   SpO2 97%   BMI 27.02 kg/m   GEN: A/Ox3; pleasant , NAD, well nourished    HEENT:  [place]/AT,  EACs Left TM red/ retracted.  NOSE-clear, THROAT-clear, no lesions, no postnasal drip or exudate noted.   NECK:  Supple w/ fair ROM; no JVD; normal carotid impulses w/o bruits; no thyromegaly or nodules palpated; no lymphadenopathy.    RESP  Clear  P & A; w/o, wheezes/ rales/ or rhonchi. no accessory muscle use, no dullness to percussion  CARD:  RRR, no m/r/g, no peripheral edema, pulses intact, no cyanosis or clubbing.  GI:   Soft & nt;  nml bowel sounds; no organomegaly or masses detected.   Musco: Warm bil, no deformities or joint swelling noted.   Neuro: alert, no focal deficits noted.    Skin: Warm, no lesions or rashes  Psych:  No change in mood or affect. No depression or anxiety.  No memory loss.  Lab Results:   Assessment & Plan:   No problem-specific Assessment & Plan notes found for this encounter.     Rubye Oaks, NP 10/26/2016

## 2016-10-26 NOTE — Telephone Encounter (Signed)
FYI - This is Libby's daughter-in-law.

## 2016-10-26 NOTE — Patient Instructions (Signed)
Zpack take as directed.  Mucinex DM Twice daily  As needed  Cough /congestion  Zyrtec 10mg . At bedtime  As needed  Drainage  Fluids and rest  Follow up with Primary Care to establish as discussed.  Please contact office for sooner follow up if symptoms do not improve or worsen or seek emergency care

## 2016-10-26 NOTE — Assessment & Plan Note (Signed)
Left OM with URI  Plan  Patient Instructions  Zpack take as directed.  Mucinex DM Twice daily  As needed  Cough /congestion  Zyrtec 10mg . At bedtime  As needed  Drainage  Fluids and rest  Follow up with Primary Care to establish as discussed.  Please contact office for sooner follow up if symptoms do not improve or worsen or seek emergency care

## 2016-12-29 ENCOUNTER — Ambulatory Visit: Payer: Self-pay | Admitting: Neurology

## 2017-05-12 ENCOUNTER — Telehealth: Payer: Self-pay | Admitting: Neurology

## 2017-05-12 NOTE — Telephone Encounter (Signed)
Patient called to make a follow up appointment. She takes Topamax. She has not been taking it lately due to having to take her mom to her cancer treatments. She is supposed to take 100 mg 2 x a day. She is trying to get back on schedule. She said it has been making her feel jittery. Please Advise. Thanks

## 2017-06-02 ENCOUNTER — Encounter: Payer: Self-pay | Admitting: Neurology

## 2017-06-10 MED FILL — TOPIRAMATE 100 MG TABLET: 100 | 90 days supply | Qty: 360 | Fill #1

## 2017-07-14 ENCOUNTER — Telehealth: Payer: 59 | Admitting: Family

## 2017-07-14 DIAGNOSIS — J208 Acute bronchitis due to other specified organisms: Secondary | ICD-10-CM | POA: Diagnosis not present

## 2017-07-14 MED ORDER — BENZONATATE 100 MG PO CAPS: 100.0000 mg | ORAL_CAPSULE | Freq: Three times a day (TID) | ORAL | 0 refills | Status: DC | PRN

## 2017-07-14 MED ORDER — PREDNISONE 10 MG (21) PO TBPK: ORAL_TABLET | ORAL | 0 refills | Status: DC

## 2017-07-14 MED FILL — BENZONATATE 100 MG CAPS: 100 | 10 days supply | Qty: 20 | Fill #0

## 2017-07-14 MED FILL — predniSONE 10 MG TABS: 10 | 6 days supply | Qty: 21 | Fill #0

## 2017-07-14 NOTE — Progress Notes (Signed)
We are sorry that you are not feeling well.  Here is how we plan to help!  Based on your presentation I believe you most likely have A cough due to a virus.  This is called viral bronchitis and is best treated by rest, plenty of fluids and control of the cough.  You may use Ibuprofen or Tylenol as directed to help your symptoms.     In addition you may use A non-prescription cough medication called Robitussin DAC. Take 2 teaspoons every 8 hours or Delsym: take 2 teaspoons every 12 hours., A non-prescription cough medication called Mucinex DM: take 2 tablets every 12 hours. and A prescription cough medication called Tessalon Perles 100mg. You may take 1-2 capsules every 8 hours as needed for your cough.  Sterapred 10 mg dosepak  From your responses in the eVisit questionnaire you describe inflammation in the upper respiratory tract which is causing a significant cough.  This is commonly called Bronchitis and has four common causes:    Allergies  Viral Infections  Acid Reflux  Bacterial Infection Allergies, viruses and acid reflux are treated by controlling symptoms or eliminating the cause. An example might be a cough caused by taking certain blood pressure medications. You stop the cough by changing the medication. Another example might be a cough caused by acid reflux. Controlling the reflux helps control the cough.  USE OF BRONCHODILATOR ("RESCUE") INHALERS: There is a risk from using your bronchodilator too frequently.  The risk is that over-reliance on a medication which only relaxes the muscles surrounding the breathing tubes can reduce the effectiveness of medications prescribed to reduce swelling and congestion of the tubes themselves.  Although you feel brief relief from the bronchodilator inhaler, your asthma may actually be worsening with the tubes becoming more swollen and filled with mucus.  This can delay other crucial treatments, such as oral steroid medications. If you need to use  a bronchodilator inhaler daily, several times per day, you should discuss this with your provider.  There are probably better treatments that could be used to keep your asthma under control.     HOME CARE . Only take medications as instructed by your medical team. . Complete the entire course of an antibiotic. . Drink plenty of fluids and get plenty of rest. . Avoid close contacts especially the very young and the elderly . Cover your mouth if you cough or cough into your sleeve. . Always remember to wash your hands . A steam or ultrasonic humidifier can help congestion.   GET HELP RIGHT AWAY IF: . You develop worsening fever. . You become short of breath . You cough up blood. . Your symptoms persist after you have completed your treatment plan MAKE SURE YOU   Understand these instructions.  Will watch your condition.  Will get help right away if you are not doing well or get worse.  Your e-visit answers were reviewed by a board certified advanced clinical practitioner to complete your personal care plan.  Depending on the condition, your plan could have included both over the counter or prescription medications. If there is a problem please reply  once you have received a response from your provider. Your safety is important to us.  If you have drug allergies check your prescription carefully.    You can use MyChart to ask questions about today's visit, request a non-urgent call back, or ask for a work or school excuse for 24 hours related to this e-Visit. If it has   been greater than 24 hours you will need to follow up with your provider, or enter a new e-Visit to address those concerns. You will get an e-mail in the next two days asking about your experience.  I hope that your e-visit has been valuable and will speed your recovery. Thank you for using e-visits.   

## 2017-07-16 ENCOUNTER — Ambulatory Visit (INDEPENDENT_AMBULATORY_CARE_PROVIDER_SITE_OTHER): Payer: 59 | Admitting: Neurology

## 2017-07-16 ENCOUNTER — Encounter: Payer: Self-pay | Admitting: Neurology

## 2017-07-16 VITALS — BP 112/78 | HR 77 | Ht 65.0 in | Wt 158.0 lb

## 2017-07-16 DIAGNOSIS — G43009 Migraine without aura, not intractable, without status migrainosus: Secondary | ICD-10-CM

## 2017-07-16 MED ORDER — TOPIRAMATE 100 MG PO TABS: ORAL_TABLET | ORAL | 3 refills | Status: DC

## 2017-07-16 NOTE — Patient Instructions (Signed)
Great seeing you! Continue Topamax 100mg  twice a day. Keep a calendar of your migraines. Follow-up in 1 year, call for any changes.

## 2017-07-16 NOTE — Progress Notes (Signed)
NEUROLOGY FOLLOW UP OFFICE NOTE  [name] [id number]  HISTORY OF PRESENT ILLNESS: I had the pleasure of seeing [name] in follow-up in the neurology clinic on 07/16/2017.  The patient was last seen a year ago for migraines. She reports doing well with her migraines, she has around 3-4 migraines a month that would last all day, last migraine was 2 weeks ago. Sometimes migraines are associated with nausea. The Reglan sometimes helps, but she does not need it regularly. She takes prn Ibuprofen 800mg  for the migraines. Cambia was not helpful. She cannot take triptans. She denies any side effects to Topamax 100mg  BID. It appears she had difficulties with higher dose, and feels she is at a good spot with current dose. No dizziness, diplopia, neck pain, focal numbness/tingling/weakness, no falls. Sleep is fine.   HPI:  This is a very pleasant 41 yo RH woman with a history of left cerebellar strokes in 2008 and migraines without aura. She started having migraines at age 41, with throbbing retro-orbital pain with associated nausea, vomiting, blurred vision, dizziness, photo and phonophobia. She was initially having headaches every other week. She saw a neurologist at [facility] and started on Topamax for headache prophylaxis, which significantly helped control her migraines. She was lost to follow-up and ran out of Topamax with an increase in migraine frequency. She denies any prior aura to the migraines. No associated focal numbness/tingling/weakness or confusion. Triggers include her menstrual period, hotdogs. She reports sleep is fine, and not a trigger for headaches. Her mother has migraines.   She was diagnosed with the left cerebellar strokes in 2008 after she presented with vision changes where she saw a squiggly line. MRI brain at that time showed several foci of abnormal signal in the left cerebellum affecting primarily the lateral and inferior aspect of the  hemisphere largely sparing the white matter. A tiny similar lesion is seen in the right inferior cerebellum. The brainstem is unaffected. On diffusion imaging, these areas are inapparent, that is, they appear isointense with surrounding brain. However on post-infusion images all of these areas show abnormal enhancement. She had a vascular, cardiac, and hypercoagulable workup that were unrevealing. She continues on a daily baby aspirin.   In 2008 and 2009, she started having recurrent episodes of unresponsiveness, 5 episodes were captured with vEEG monitoring, with no associated EEG changes, indicating these were non-epileptic. She reports no further spells since 2013  PAST MEDICAL HISTORY: Past Medical History:  Diagnosis Date  . Cigarette smoker   . Dyslipidemia   . Herpes simplex infection   . History of cerebrovascular accident   . History of syncope   . Migraine headache     MEDICATIONS:   Outpatient Encounter Prescriptions as of 07/16/2017  Medication Sig  . aspirin EC 81 MG tablet Take 81 mg by mouth daily.  . Multiple Vitamin (MULTIVITAMIN) capsule Take 1 capsule by mouth daily.  Marland Kitchen. topiramate (TOPAMAX) 100 MG tablet Take 1 tablet twice a day  . [DISCONTINUED] benzonatate (TESSALON PERLES) 100 MG capsule Take 1 capsule (100 mg total) by mouth 3 (three) times daily as needed.  . [DISCONTINUED] CAMBIA 50 MG PACK Take 1 as needed at onset of headache. Do not take more than 3 a week  . [DISCONTINUED] diclofenac (VOLTAREN) 50 MG EC tablet Take 1 tablet at onset of migraine. Do not take more than 2-3 a week  . [DISCONTINUED] ibuprofen (ADVIL,MOTRIN) 800 MG tablet Take 1 tablet for severe headache. Do  not take more than 2-3 a week  . [DISCONTINUED] metoCLOPramide (REGLAN) 5 MG tablet Take every 8 hours as needed for migraine-associated nausea and vomiting  . [DISCONTINUED] predniSONE (STERAPRED UNI-PAK 21 TAB) 10 MG (21) TBPK tablet Use as directed  . [DISCONTINUED] topiramate (TOPAMAX)  100 MG tablet Take 2 tablets twice a day   No facility-administered encounter medications on file as of 07/16/2017.    ALLERGIES: Allergies  Allergen Reactions  . Meclizine Hcl     FAMILY HISTORY: Family History  Problem Relation Age of Onset  . Asthma Sister   . Clotting disorder Father   . Heart attack Father 45  . Clotting disorder Paternal Uncle   . Rheum arthritis Maternal Grandmother   . Throat cancer Maternal Grandmother   . Diabetes Maternal Grandmother   . Heart attack Paternal Uncle        7 uncles    SOCIAL HISTORY: Social History   Social History  . Marital status: Married    Spouse name: N/A  . Number of children: 2  . Years of education: N/A   Occupational History  . 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 Smoking status: Former Smoker    Packs/day: 1.00    Years: 6.00    Types: Cigarettes    Quit date: 09/28/2006  . Smokeless tobacco: Not on file  . Alcohol use No  . Drug use: No  . Sexual activity: Not on file   Other Topics Concern  . Not on file   Social History Narrative  . No narrative on file    REVIEW OF SYSTEMS: Constitutional: No fevers, chills, or sweats, no generalized fatigue, change in appetite Eyes: No visual changes, double vision, eye pain Ear, nose and throat: No hearing loss, ear pain, nasal congestion, sore throat Cardiovascular: No chest pain, palpitations Respiratory:  No shortness of breath at rest or with exertion, wheezes GastrointestinaI: No nausea, vomiting, diarrhea, abdominal pain, fecal incontinence Genitourinary:  No dysuria, urinary retention or frequency Musculoskeletal:  No neck pain, back pain Integumentary: No rash, pruritus, skin lesions Neurological: as above Psychiatric: No depression, insomnia, anxiety Endocrine: No palpitations, fatigue, diaphoresis, mood swings, change in appetite, change in weight, increased thirst Hematologic/Lymphatic:  No anemia, purpura,  petechiae. Allergic/Immunologic: no itchy/runny eyes, nasal congestion, recent allergic reactions, rashes  PHYSICAL EXAM: Vitals:   07/16/17 1427  BP: 112/78  Pulse: 77  SpO2: 99%   General: No acute distress Head:  Normocephalic/atraumatic Neck: supple, no paraspinal tenderness, full range of motion Heart:  Regular rate and rhythm Lungs:  Clear to auscultation bilaterally Back: No paraspinal tenderness Skin/Extremities: No rash, no edema Neurological Exam: alert and oriented to person, place, and time. No aphasia or dysarthria. Fund of knowledge is appropriate.  Recent and remote memory are intact. 3/3 delayed recall. Attention and concentration are normal.    Able to name objects and repeat phrases. Cranial nerves: Pupils equal, round, reactive to light.  Fundoscopic exam unremarkable, no papilledema. Extraocular movements intact with no nystagmus. Visual fields full. Facial sensation intact. No facial asymmetry. Tongue, uvula, palate midline.  Motor: Bulk and tone normal, muscle strength 5/5 throughout with no pronator drift.  Sensation to light touch intact.  No extinction to double simultaneous stimulation.  Deep tendon reflexes 2+ throughout, toes downgoing.  Finger to nose testing intact.  Gait narrow-based and steady, able to tandem walk adequately.  Romberg negative.  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 Topamax helped significantly for headache prophylaxis, she is satisfied with current migraine frequency, continue Topamax 100mg  BID, prn Ibuprofen 800mg  for migraine rescue. She occasionally takes prn Reglan for nausea. No current pregnancy plans. She is taking a daily baby aspirin for secondary stroke prevention, she continues to follow-up with her PCP for control of vascular risk factors. She will keep a calendar of her  migraines and follow-up in a year. She knows to call our office for any problems.  Thank you for allowing me to participate in her care.  Please do not hesitate to call for any questions or concerns.  The duration of this appointment visit was 15 minutes of face-to-face time with the patient.  Greater than 50% of this time was spent in counseling, explanation of diagnosis, planning of further management, and coordination of care.   Patrcia Dolly, M.D.   CC: Rubye Oaks, NP

## 2017-07-26 ENCOUNTER — Telehealth: Payer: 59 | Admitting: Family

## 2017-07-26 DIAGNOSIS — J028 Acute pharyngitis due to other specified organisms: Secondary | ICD-10-CM | POA: Diagnosis not present

## 2017-07-26 DIAGNOSIS — B9689 Other specified bacterial agents as the cause of diseases classified elsewhere: Secondary | ICD-10-CM

## 2017-07-26 MED ORDER — PREDNISONE 5 MG PO TABS: 5.0000 mg | ORAL_TABLET | ORAL | 0 refills | Status: DC

## 2017-07-26 MED ORDER — AZITHROMYCIN 250 MG PO TABS: ORAL_TABLET | ORAL | 0 refills | Status: DC

## 2017-07-26 MED FILL — AZITHROMYCIN 250 MG TABLET: 250 | 5 days supply | Qty: 6 | Fill #0

## 2017-07-26 MED FILL — predniSONE 5 MG TABS: 5 | 6 days supply | Qty: 21 | Fill #0

## 2017-07-26 NOTE — Progress Notes (Signed)
Thank you for the details you included in the comment boxes. Those details are very helpful in determining the best course of treatment for you and help us to provide the best care. I don't see any antibiotics on your chart. They diagnosed you with viral bronchitis which wouldn't need an antibiotic. However, given the length of time, this presents like bacterial bronchitis which would need antibiotics for sure. See plan below. I tried to call you earlier for more info.   We are sorry that you are not feeling well.  Here is how we plan to help!  Based on your presentation I believe you most likely have A cough due to bacteria.  When patients have a fever and a productive cough with a change in color or increased sputum production, we are concerned about bacterial bronchitis.  If left untreated it can progress to pneumonia.  If your symptoms do not improve with your treatment plan it is important that you contact your provider.   I have prescribed Azithromyin 250 mg: two tablets now and then one tablet daily for 4 additonal days    In addition you may use A non-prescription cough medication called Mucinex DM: take 2 tablets every 12 hours.  Sterapred 5 mg dosepak  From your responses in the eVisit questionnaire you describe inflammation in the upper respiratory tract which is causing a significant cough.  This is commonly called Bronchitis and has four common causes:    Allergies  Viral Infections  Acid Reflux  Bacterial Infection Allergies, viruses and acid reflux are treated by controlling symptoms or eliminating the cause. An example might be a cough caused by taking certain blood pressure medications. You stop the cough by changing the medication. Another example might be a cough caused by acid reflux. Controlling the reflux helps control the cough.  USE OF BRONCHODILATOR ("RESCUE") INHALERS: There is a risk from using your bronchodilator too frequently.  The risk is that over-reliance on a  medication which only relaxes the muscles surrounding the breathing tubes can reduce the effectiveness of medications prescribed to reduce swelling and congestion of the tubes themselves.  Although you feel brief relief from the bronchodilator inhaler, your asthma may actually be worsening with the tubes becoming more swollen and filled with mucus.  This can delay other crucial treatments, such as oral steroid medications. If you need to use a bronchodilator inhaler daily, several times per day, you should discuss this with your provider.  There are probably better treatments that could be used to keep your asthma under control.     HOME CARE . Only take medications as instructed by your medical team. . Complete the entire course of an antibiotic. . Drink plenty of fluids and get plenty of rest. . Avoid close contacts especially the very young and the elderly . Cover your mouth if you cough or cough into your sleeve. . Always remember to wash your hands . A steam or ultrasonic humidifier can help congestion.   GET HELP RIGHT AWAY IF: . You develop worsening fever. . You become short of breath . You cough up blood. . Your symptoms persist after you have completed your treatment plan MAKE SURE YOU   Understand these instructions.  Will watch your condition.  Will get help right away if you are not doing well or get worse.  Your e-visit answers were reviewed by a board certified advanced clinical practitioner to complete your personal care plan.  Depending on the condition, your plan could  have included both over the counter or prescription medications. If there is a problem please reply  once you have received a response from your provider. Your safety is important to [place].  If you have drug allergies check your prescription carefully.    You can use MyChart to ask questions about today's visit, request a non-urgent call back, or ask for a work or school excuse for 24 hours related to this  e-Visit. If it has been greater than 24 hours you will need to follow up with your provider, or enter a new e-Visit to address those concerns. You will get an e-mail in the next two days asking about your experience.  I hope that your e-visit has been valuable and will speed your recovery. Thank you for using e-visits.

## 2017-08-13 DIAGNOSIS — J040 Acute laryngitis: Secondary | ICD-10-CM | POA: Diagnosis not present

## 2017-08-13 DIAGNOSIS — J410 Simple chronic bronchitis: Secondary | ICD-10-CM | POA: Diagnosis not present

## 2017-08-13 DIAGNOSIS — J322 Chronic ethmoidal sinusitis: Secondary | ICD-10-CM | POA: Diagnosis not present

## 2017-08-13 DIAGNOSIS — J320 Chronic maxillary sinusitis: Secondary | ICD-10-CM | POA: Diagnosis not present

## 2017-08-13 MED FILL — CEFUROXIME AXETIL 250 MG TA: 250 | 10 days supply | Qty: 20 | Fill #0

## 2017-10-21 ENCOUNTER — Other Ambulatory Visit: Payer: Self-pay | Admitting: *Deleted

## 2017-10-21 ENCOUNTER — Encounter: Payer: Self-pay | Admitting: Neurology

## 2017-10-21 DIAGNOSIS — G43009 Migraine without aura, not intractable, without status migrainosus: Secondary | ICD-10-CM

## 2017-10-21 MED ORDER — TOPIRAMATE 100 MG PO TABS: ORAL_TABLET | ORAL | 3 refills | Status: DC

## 2017-12-20 ENCOUNTER — Encounter: Payer: Self-pay | Admitting: Gastroenterology

## 2018-01-21 ENCOUNTER — Ambulatory Visit: Payer: Self-pay | Admitting: Gastroenterology

## 2018-02-08 ENCOUNTER — Encounter: Payer: Self-pay | Admitting: Neurology

## 2018-07-15 ENCOUNTER — Ambulatory Visit: Payer: Self-pay | Admitting: Neurology

## 2018-07-27 ENCOUNTER — Telehealth: Payer: 59 | Admitting: Nurse Practitioner

## 2018-07-27 DIAGNOSIS — R05 Cough: Secondary | ICD-10-CM | POA: Diagnosis not present

## 2018-07-27 DIAGNOSIS — R059 Cough, unspecified: Secondary | ICD-10-CM

## 2018-07-27 MED ORDER — BENZONATATE 100 MG PO CAPS: 100.0000 mg | ORAL_CAPSULE | Freq: Three times a day (TID) | ORAL | 0 refills | Status: DC | PRN

## 2018-07-27 MED ORDER — DOXYCYCLINE HYCLATE 100 MG PO TABS: 100.0000 mg | ORAL_TABLET | Freq: Two times a day (BID) | ORAL | 0 refills | Status: DC

## 2018-07-27 NOTE — Progress Notes (Signed)

## 2019-03-03 MED FILL — MEDROXYPROGESTERONE 10 MG T: 10 | 21 days supply | Qty: 21 | Fill #0

## 2019-03-14 MED FILL — FEMYNOR 0.25-35 MG-MCG TABS: 0.25-35 | 28 days supply | Qty: 28 | Fill #0

## 2019-06-12 ENCOUNTER — Other Ambulatory Visit: Payer: Self-pay

## 2019-06-12 ENCOUNTER — Ambulatory Visit (INDEPENDENT_AMBULATORY_CARE_PROVIDER_SITE_OTHER): Payer: Self-pay | Admitting: Women's Health

## 2019-06-12 ENCOUNTER — Encounter: Payer: Self-pay | Admitting: Women's Health

## 2019-06-12 ENCOUNTER — Other Ambulatory Visit (HOSPITAL_COMMUNITY)
Admission: RE | Admit: 2019-06-12 | Discharge: 2019-06-12 | Disposition: A | Discharge status: Home / Self Care | Payer: Self-pay | Source: Ambulatory Visit | Attending: Obstetrics & Gynecology | Admitting: Obstetrics & Gynecology

## 2019-06-12 VITALS — BP 139/94 | HR 89 | Ht 62.0 in | Wt 150.0 lb

## 2019-06-12 DIAGNOSIS — Z01419 Encounter for gynecological examination (general) (routine) without abnormal findings: Secondary | ICD-10-CM | POA: Insufficient documentation

## 2019-06-12 DIAGNOSIS — R030 Elevated blood-pressure reading, without diagnosis of hypertension: Secondary | ICD-10-CM

## 2019-06-12 DIAGNOSIS — N926 Irregular menstruation, unspecified: Secondary | ICD-10-CM

## 2019-06-12 DIAGNOSIS — N920 Excessive and frequent menstruation with regular cycle: Secondary | ICD-10-CM

## 2019-06-12 DIAGNOSIS — N841 Polyp of cervix uteri: Secondary | ICD-10-CM

## 2019-06-12 MED ORDER — MEGESTROL ACETATE 40 MG PO TABS: ORAL_TABLET | ORAL | 1 refills | Status: DC

## 2019-06-12 NOTE — Progress Notes (Addendum)
   GYN VISIT Patient name: [name] MRN [id number]  Date of birth: May 06, 1976 Chief Complaint:   Menorrhagia (bleeding since June)  History of Present Illness:   [name] is a 43 y.o. G40P2002 Caucasian female being seen today for prolonged heavy bleeding. Periods usually regular towards end of month w/ heavy flow & clots x 5-7d. Last period started at end of June and has never stopped, at heaviest is changing pad q 53mins. No cramping/pain. First time this has ever happened. Denies abnormal discharge, itching/odor/irritation.  Denies change in sex partner. Past due for pap. Went to [facility], rx'd COCs which actually made bleeding worse, then provera which helped some, but still bleeding. Today has been light.     No h/o HTN  No LMP recorded. The current method of family planning is normally uses condoms, has been on coc's since July.  Last pap >3-60yrs ago. Results were:  normal Review of Systems:   Pertinent items are noted in HPI Denies fever/chills, dizziness, headaches, visual disturbances, fatigue, shortness of breath, chest pain, abdominal pain, vomiting, abnormal vaginal discharge/itching/odor/irritation, problems with periods, bowel movements, urination, or intercourse unless otherwise stated above.  Pertinent History Reviewed:  Reviewed past medical,surgical, social, obstetrical and family history.  Reviewed problem list, medications and allergies. Physical Assessment:   Vitals:   06/12/19 1349 06/12/19 1350  BP: (!) 146/99 (!) 139/94  Pulse: (!) 106 89  Weight: 150 lb (68 kg)   Height: 5\' 2"  (1.575 m)   Body mass index is 27.44 kg/m.       Physical Examination:   General appearance: alert, well appearing, and in no distress  Mental status: alert, oriented to person, place, and time  Skin: warm & dry   Cardiovascular: normal heart rate noted  Respiratory: normal respiratory effort, no distress  Abdomen: soft, non-tender   Pelvic: VULVA:  normal appearing vulva with no masses, tenderness or lesions, VAGINA: normal appearing vagina with normal color and discharge, no lesions, no bleeding seen right now CERVIX: thin prep pap obtained, normal appearing cervix without discharge, polyp noted, co-exam w/ Dr. Ernestina Patches, unable to tell where it starts (cervical vs. endometrial) UTERUS: uterus is normal size, shape, consistency and nontender, ADNEXA: normal adnexa in size, nontender and no masses  Extremities: no edema   No results found for this or any previous visit (from the past 24 hour(s)).  Assessment & Plan:  1) Prolonged period w/ menorrhagia> pap today w/ gc/ct, stop coc's and provera. No vaginal bleeding on exam today. Rx megace to have if prolonged bleeding returns. Discussed option of Liletta IUD, will see how bleeding does for now. Currently self-pay, has applied for mcaid.  2) Cervical vs endometrial polyp> Dr. Ernestina Patches recommended f/u w/ [facility] or [facility] for removal  3) Elevated bp> will check again at f/u  Meds:  Meds ordered this encounter  Medications  . megestrol (MEGACE) 40 MG tablet    Sig: 3x5d, 2x5d, then 1 daily to help control vaginal bleeding. Stop taking when bleeding stops.    Dispense:  45 tablet    Refill:  1    Order Specific Question:   Supervising Provider    Answer:   Elonda Husky, LUTHER H [[id number]]    No orders of the defined types were placed in this encounter.   Return for f/u w/ [facility] or [facility] in office.  [place], [facility] 06/12/2019 2:59 PM

## 2019-06-12 NOTE — Patient Instructions (Signed)
Stop birth control pills and provera Take megace (megesterol) if needed

## 2019-06-15 LAB — CYTOLOGY - PAP
Chlamydia: NEGATIVE
Diagnosis: NEGATIVE
HPV: NOT DETECTED
Neisseria Gonorrhea: NEGATIVE

## 2019-06-19 ENCOUNTER — Other Ambulatory Visit: Payer: Self-pay

## 2019-06-19 ENCOUNTER — Encounter: Payer: Self-pay | Admitting: Obstetrics & Gynecology

## 2019-06-19 ENCOUNTER — Ambulatory Visit (INDEPENDENT_AMBULATORY_CARE_PROVIDER_SITE_OTHER): Payer: Self-pay | Admitting: Obstetrics & Gynecology

## 2019-06-19 VITALS — BP 143/90 | HR 88 | Ht 61.0 in | Wt 170.0 lb

## 2019-06-19 DIAGNOSIS — N920 Excessive and frequent menstruation with regular cycle: Secondary | ICD-10-CM

## 2019-06-19 NOTE — Progress Notes (Signed)
Chief Complaint  Patient presents with  . Menorrhagia      43 y.o. [id number] No LMP recorded. The current method of family planning is condoms all of the time.  Outpatient Encounter Medications as of 06/19/2019  Medication Sig  . JUNEL FE 1/20 1-20 MG-MCG tablet Take 1 tablet by mouth daily.  . medroxyPROGESTERone (PROVERA) 10 MG tablet Take 10 mg by mouth daily.  . megestrol (MEGACE) 40 MG tablet 3x5d, 2x5d, then 1 daily to help control vaginal bleeding. Stop taking when bleeding stops. (Patient not taking: Reported on 06/19/2019)   No facility-administered encounter medications on file as of 06/19/2019.     Subjective Pt has had bleeding issues of greater and lesser dgrees now for 3 months No cramping issues Sometimes the bleeding has been large clots Past Medical History:  Diagnosis Date  . Cigarette smoker   . Dyslipidemia   . Herpes simplex infection   . History of cerebrovascular accident   . History of syncope   . Migraine headache     Past Surgical History:  Procedure Laterality Date  . None      OB History    Gravida  2   Para  2   Term  2   Preterm      AB      Living  2     SAB      TAB      Ectopic      Multiple      Live Births  2           Allergies  Allergen Reactions  . Meclizine Hcl     Social History   Socioeconomic History  . Marital status: Married    Spouse name: Not on file  . Number of children: 2  . Years of education: Not on file  . Highest education level: Not on file  Occupational History  . Occupation: Medical Records at [employer]  Social Needs  . Financial resource strain: Not on file  . Food insecurity    Worry: Not on file    Inability: Not on file  . Transportation needs    Medical: Not on file    Non-medical: Not on file  Tobacco Use  . Smoking status: Former Smoker    Packs/day: 1.00    Years: 6.00    Pack years: 6.00    Types: Cigarettes    Quit date: 09/28/2006    Years since  quitting: 12.7  . Smokeless tobacco: Never Used  Substance and Sexual Activity  . Alcohol use: No  . Drug use: No  . Sexual activity: Yes    Birth control/protection: Pill  Lifestyle  . Physical activity    Days per week: Not on file    Minutes per session: Not on file  . Stress: Not on file  Relationships  . Social Musicianconnections    Talks on phone: Not on file    Gets together: Not on file    Attends religious service: Not on file    Active member of club or organization: Not on file    Attends meetings of clubs or organizations: Not on file    Relationship status: Not on file  Other Topics Concern  . Not on file  Social History Narrative  . Not on file    Family History  Problem Relation Age of Onset  . Asthma Sister   . Clotting disorder Father   . Heart attack  Father 40  . Clotting disorder Paternal Uncle   . Rheum arthritis Maternal Grandmother   . Throat cancer Maternal Grandmother   . Diabetes Maternal Grandmother   . Heart attack Paternal Uncle        7 uncles    Medications:       Current Outpatient Medications:  .  JUNEL FE 1/20 1-20 MG-MCG tablet, Take 1 tablet by mouth daily., Disp: , Rfl:  .  medroxyPROGESTERone (PROVERA) 10 MG tablet, Take 10 mg by mouth daily., Disp: , Rfl:  .  megestrol (MEGACE) 40 MG tablet, 3x5d, 2x5d, then 1 daily to help control vaginal bleeding. Stop taking when bleeding stops. (Patient not taking: Reported on 06/19/2019), Disp: 45 tablet, Rfl: 1  Objective Blood pressure (!) 143/90, pulse 88, height 5\' 1"  (1.549 m), weight 170 lb (77.1 kg).  General WDWN female NAD Vulva:  normal appearing vulva with no masses, tenderness or lesions Vagina:  normal mucosa, no discharge Cervix:  Normal no lesions Uterus:  normal size, contour, position, consistency, mobility, non-tender Adnexa: ovaries:present,  normal adnexa in size, nontender and no masses   Pertinent ROS No burning with urination, frequency or urgency No nausea, vomiting  or diarrhea Nor fever chills or other constitutional symptoms   Labs or studies     Impression Diagnoses this Encounter::   ICD-10-CM   1. Menorrhagia with regular cycle  N92.0 US PELVIS TRANSVAGINAL NON-OB (TV ONLY)    US PELVIS (TRANSABDOMINAL ONLY)    Established relevant diagnosis(es):   Plan/Recommendations: No orders of the defined types were placed in this encounter.   Labs or Scans Ordered: Orders Placed This Encounter  Procedures  . US PELVIS TRANSVAGINAL NON-OB (TV ONLY)  . US PELVIS (TRANSABDOMINAL ONLY)    Management:: >begin megestrol as prescribed >sonogram 6 weeks  Follow up No follow-ups on file.           All questions were answered.

## 2019-07-31 ENCOUNTER — Encounter: Payer: Self-pay | Admitting: Obstetrics & Gynecology

## 2019-07-31 ENCOUNTER — Other Ambulatory Visit: Payer: Self-pay

## 2019-07-31 ENCOUNTER — Ambulatory Visit (INDEPENDENT_AMBULATORY_CARE_PROVIDER_SITE_OTHER): Payer: Self-pay

## 2019-07-31 ENCOUNTER — Ambulatory Visit (INDEPENDENT_AMBULATORY_CARE_PROVIDER_SITE_OTHER): Payer: Self-pay | Admitting: Obstetrics & Gynecology

## 2019-07-31 VITALS — BP 135/93 | HR 91 | Ht 61.0 in | Wt 173.0 lb

## 2019-07-31 DIAGNOSIS — N920 Excessive and frequent menstruation with regular cycle: Secondary | ICD-10-CM

## 2019-07-31 MED ORDER — DESOGESTREL-ETHINYL ESTRADIOL 0.15-0.02/0.01 MG (21/5) PO TABS: 1.0000 | ORAL_TABLET | Freq: Every day | ORAL | 11 refills | Status: AC

## 2019-07-31 NOTE — Progress Notes (Signed)
PELVIC US TA/TV homogeneous anteverted uterus,wnl,EEC 10.1 mm,normal ovaries bilat,ovaries appear mobile,no pain during ultrasound,no free fluid

## 2019-07-31 NOTE — Progress Notes (Signed)
Follow up appointment for results  Chief Complaint  Patient presents with  . discuss [place]    Blood pressure (!) 135/93, pulse 91, height 5\' 1"  (1.549 m), weight 173 lb (78.5 kg).   Amenorrheic on the megestrol  Pelvis Transvaginal Non-ob (tv Only)  Result Date: 07/31/2019 GYNECOLOGIC SONOGRAM [name] is a 43 y.o. 45 unknown LMP,she is here for a pelvic sonogram for menometrorrhagia. Uterus                    7.8 x 4.4 x 5.4 cm, Total uterine volume 97 cc, homogeneous anteverted uterus,wnl Endometrium          10 mm, symmetrical, wnl Right ovary             2.4 x 1.5 x 2.5 cm, wnl Left ovary                2.6 x 1.8 x 2.9 cm, wnl No free fluid Technician Comments: PELVIC [id number] TA/TV homogeneous anteverted uterus,wnl,EEC 10.1 mm,normal ovaries bilat,ovaries appear mobile,no pain during ultrasound,no free fluid Chaperone: [place] 07/31/2019 8:57 AM Clinical Impression and recommendations: I have reviewed the sonogram results above, combined with the patient's current clinical course, below are my impressions and any appropriate recommendations for management based on the sonographic findings. Uterus and endometrium are normal Ovaries are normal No anatomical etiology for her menorrhagia 13/10/2018 07/31/2019 8:58 AM  13/10/2018 Pelvis (transabdominal Only)  Result Date: 07/31/2019 GYNECOLOGIC SONOGRAM [name] is a 43 y.o. 45 unknown LMP,she is here for a pelvic sonogram for menometrorrhagia. Uterus                    7.8 x 4.4 x 5.4 cm, Total uterine volume 97 cc, homogeneous anteverted uterus,wnl Endometrium          10 mm, symmetrical, wnl Right ovary             2.4 x 1.5 x 2.5 cm, wnl Left ovary                2.6 x 1.8 x 2.9 cm, wnl No free fluid Technician Comments: PELVIC [id number] TA/TV homogeneous anteverted uterus,wnl,EEC 10.1 mm,normal ovaries bilat,ovaries appear mobile,no pain during ultrasound,no free fluid Chaperone: [place] 07/31/2019 8:57 AM  Clinical Impression and recommendations: I have reviewed the sonogram results above, combined with the patient's current clinical course, below are my impressions and any appropriate recommendations for management based on the sonographic findings. Uterus and endometrium are normal Ovaries are normal No anatomical etiology for her menorrhagia 13/10/2018 07/31/2019 8:58 AM     MEDS ordered this encounter: Meds ordered this encounter  Medications  . desogestrel-ethinyl estradiol (MIRCETTE) 0.15-0.02/0.01 MG (21/5) tablet    Sig: Take 1 tablet by mouth daily.    Dispense:  1 Package    Refill:  11    Orders for this encounter: No orders of the defined types were placed in this encounter.   Impression:   ICD-10-CM   1. Menorrhagia with regular cycle  N92.0      Plan: >stop megestrol  >begin OCP in 1 week and see if that will manage her cycles  Follow Up: Return if symptoms worsen or fail to improve.       Face to face time:  10 minutes  Greater than 50% of the visit time was spent in counseling and coordination of care with the patient.  The  summary and outline of the counseling and care coordination is summarized in the note above.   All questions were answered.  Past Medical History:  Diagnosis Date  . Cigarette smoker   . Dyslipidemia   . Herpes simplex infection   . History of cerebrovascular accident   . History of syncope   . Migraine headache     Past Surgical History:  Procedure Laterality Date  . None      OB History    Gravida  2   Para  2   Term  2   Preterm      AB      Living  2     SAB      TAB      Ectopic      Multiple      Live Births  2           Allergies  Allergen Reactions  . Meclizine Hcl     Social History   Socioeconomic History  . Marital status: Married    Spouse name: Not on file  . Number of children: 2  . Years of education: Not on file  . Highest education level: Not on file  Occupational  History  . Occupation: Medical Records at [employer]  . Financial resource strain: Not on file  . Food insecurity    Worry: Not on file    Inability: Not on file  . Transportation needs    Medical: Not on file    Non-medical: Not on file  Tobacco Use  . Smoking status: Former Smoker    Packs/day: 1.00    Years: 6.00    Pack years: 6.00    Types: Cigarettes    Quit date: 09/28/2006    Years since quitting: 12.8  . Smokeless tobacco: Never Used  Substance and Sexual Activity  . Alcohol use: No  . Drug use: No  . Sexual activity: Yes    Birth control/protection: None, Condom  Lifestyle  . Physical activity    Days per week: Not on file    Minutes per session: Not on file  . Stress: Not on file  Relationships  . Social Herbalist on phone: Not on file    Gets together: Not on file    Attends religious service: Not on file    Active member of club or organization: Not on file    Attends meetings of clubs or organizations: Not on file    Relationship status: Not on file  Other Topics Concern  . Not on file  Social History Narrative  . Not on file    Family History  Problem Relation Age of Onset  . Asthma Sister   . Clotting disorder Father   . Heart attack Father 37  . Clotting disorder Paternal Uncle   . Rheum arthritis Maternal Grandmother   . Throat cancer Maternal Grandmother   . Diabetes Maternal Grandmother   . Heart attack Paternal Uncle        7 uncles

## 2019-08-09 ENCOUNTER — Other Ambulatory Visit: Payer: Self-pay | Admitting: Women's Health

## 2019-10-11 ENCOUNTER — Other Ambulatory Visit: Payer: Self-pay

## 2019-10-11 ENCOUNTER — Ambulatory Visit
Admission: EM | Admit: 2019-10-11 | Discharge: 2019-10-11 | Disposition: A | Discharge status: Home / Self Care | Payer: Self-pay | Attending: Emergency Medicine | Admitting: Emergency Medicine

## 2019-10-11 DIAGNOSIS — J339 Nasal polyp, unspecified: Secondary | ICD-10-CM

## 2019-10-11 MED ORDER — PREDNISONE 10 MG (21) PO TBPK: ORAL_TABLET | Freq: Every day | ORAL | 0 refills | Status: DC

## 2019-10-11 NOTE — ED Triage Notes (Signed)
Pt presents with c/o nasal polyp in right nares that developed about 3 weeks ago

## 2019-10-11 NOTE — Discharge Instructions (Addendum)
Rest and drink plenty of fluids Prednisone prescribed.  Take as directed for inflammation Follow up with ENT for further evaluation and managment Return here or go to the ER if you have any new or worsening symptoms fever, chills, nausea, vomiting, sore throat, cough, shortness of breath, chest pain, etc..Marland Kitchen

## 2019-10-11 NOTE — ED Provider Notes (Signed)
[facility]   [id number] 10/11/19 Arrival Time: 4098  CC: Nasal polyp   SUBJECTIVE: History from: patient.  Ortha Metts Feldkamp is a 44 y.o. female who presents with possible nasal polyp that has been bothersome x 3 weeks.  Denies a precipitating event, trauma.  Denies sick exposure to COVID, flu or strep.  Denies pain, just reports difficulty breathing through her RT nostril.  Worse at night.  Patient has tried OTC flonase without relief.  Denies similar symptoms in the past.  Complains of intermittent nose bleeds.  Denies fever, chills, fatigue, rhinorrhea, sore throat, cough, SOB, wheezing, chest pain, nausea, changes in bowel or bladder habits.    ROS: As per HPI.  All other pertinent ROS negative.     Past Medical History:  Diagnosis Date  . Cigarette smoker   . Dyslipidemia   . Herpes simplex infection   . History of cerebrovascular accident   . History of syncope   . Migraine headache    Past Surgical History:  Procedure Laterality Date  . None     Allergies  Allergen Reactions  . Meclizine Hcl    No current facility-administered medications on file prior to encounter.   Current Outpatient Medications on File Prior to Encounter  Medication Sig Dispense Refill  . desogestrel-ethinyl estradiol (MIRCETTE) 0.15-0.02/0.01 MG (21/5) tablet Take 1 tablet by mouth daily. 1 Package 11   Social History   Socioeconomic History  . Marital status: Married    Spouse name: Not on file  . Number of children: 2  . Years of education: Not on file  . Highest education level: Not on file  Occupational History  . 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 Smoking status: Former Smoker    Packs/day: 1.00    Years: 6.00    Pack years: 6.00    Types: Cigarettes    Quit date: 09/28/2006    Years since quitting: 13.0  . Smokeless tobacco: Never Used  Substance and Sexual Activity  . Alcohol use: No  . Drug use: No  . Sexual activity: Yes    Birth  control/protection: None, Condom  Other Topics Concern  . Not on file  Social History Narrative  . Not on file   Social Determinants of Health   Financial Resource Strain:   . Difficulty of Paying Living Expenses: Not on file  Food Insecurity:   . Worried About Charity fundraiser in the Last Year: Not on file  . Ran Out of Food in the Last Year: Not on file  Transportation Needs:   . Lack of Transportation (Medical): Not on file  . Lack of Transportation (Non-Medical): Not on file  Physical Activity:   . Days of Exercise per Week: Not on file  . Minutes of Exercise per Session: Not on file  Stress:   . Feeling of Stress : Not on file  Social Connections:   . Frequency of Communication with Friends and Family: Not on file  . Frequency of Social Gatherings with Friends and Family: Not on file  . Attends Religious Services: Not on file  . Active Member of Clubs or Organizations: Not on file  . Attends Archivist Meetings: Not on file  . Marital Status: Not on file  Intimate Partner Violence:   . Fear of Current or Ex-Partner: Not on file  . Emotionally Abused: Not on file  . Physically Abused: Not on file  . Sexually Abused: Not on file  Family History  Problem Relation Age of Onset  . Asthma Sister   . Clotting disorder Father   . Heart attack Father 21  . Clotting disorder Paternal Uncle   . Rheum arthritis Maternal Grandmother   . Throat cancer Maternal Grandmother   . Diabetes Maternal Grandmother   . Heart attack Paternal Uncle        7 uncles    OBJECTIVE:  Vitals:   10/11/19 1344  BP: (!) 168/126  Pulse: (!) 117  Resp: 16  Temp: 99 F (37.2 C)  TempSrc: Oral  SpO2: 98%    HR: recheck HR during exam 103 bpm  General appearance: alert; well-appearing HEENT: Ears: EACs clear, TMs pearly gray with visible cone of light, without erythema; Eyes: PERRL, EOMI grossly; Nose: patent with erythema, possible polyp visualized in RT nostril; Throat:  oropharynx clear, tonsils not erythematous or enlarged, uvula midline Neck: supple without LAD Lungs: unlabored respirations, symmetrical air entry; cough: absent; no respiratory distress Heart: regular rate and rhythm.  Skin: warm and dry Psychological: alert and cooperative; normal mood and affect  ASSESSMENT & PLAN:  1. Nasal polyp, unspecified     Meds ordered this encounter  Medications  . predniSONE (STERAPRED UNI-PAK 21 TAB) 10 MG (21) TBPK tablet    Sig: Take by mouth daily. Take 6 tabs by mouth daily  for 2 days, then 5 tabs for 2 days, then 4 tabs for 2 days, then 3 tabs for 2 days, 2 tabs for 2 days, then 1 tab by mouth daily for 2 days    Dispense:  42 tablet    Refill:  0    Order Specific Question:   Supervising Provider    Answer:   Eustace Moore [[id number]]    Rest and drink plenty of fluids Prednisone prescribed.  Take as directed for inflammation Follow up with ENT for further evaluation and managment Return here or go to the ER if you have any new or worsening symptoms fever, chills, nausea, vomiting, sore throat, cough, shortness of breath, chest pain, etc...  Reviewed expectations re: course of current medical issues. Questions answered. Outlined signs and symptoms indicating need for more acute intervention. Patient verbalized understanding. After Visit Summary given.         Rennis Harding, PA-C 10/11/19 1424

## 2019-10-23 ENCOUNTER — Encounter (INDEPENDENT_AMBULATORY_CARE_PROVIDER_SITE_OTHER): Payer: Self-pay | Admitting: Otolaryngology

## 2019-10-23 ENCOUNTER — Ambulatory Visit (INDEPENDENT_AMBULATORY_CARE_PROVIDER_SITE_OTHER): Payer: Self-pay | Admitting: Otolaryngology

## 2019-10-23 ENCOUNTER — Other Ambulatory Visit: Payer: Self-pay

## 2019-10-23 VITALS — Temp 97.9°F

## 2019-10-23 DIAGNOSIS — J310 Chronic rhinitis: Secondary | ICD-10-CM

## 2019-10-23 DIAGNOSIS — J3489 Other specified disorders of nose and nasal sinuses: Secondary | ICD-10-CM

## 2019-10-23 NOTE — Progress Notes (Signed)
HPI: Takeisha Cianci Masella is a 44 y.o. female who presents for evaluation of right-sided nasal obstruction.  She feels like she has a polyp in the right nostril.  She has had trouble breathing through the right nostril for several weeks now.  She has had occasional bleeding from the right side.  Denies any pain or pressure. No yellow-green discharge from the nose and no paranasal pain or pressure on palpation.  Past Medical History:  Diagnosis Date  . Cigarette smoker   . Dyslipidemia   . Herpes simplex infection   . History of cerebrovascular accident   . History of syncope   . Migraine headache    Past Surgical History:  Procedure Laterality Date  . None     Social History   Socioeconomic History  . Marital status: Married    Spouse name: Not on file  . Number of children: 2  . Years of education: Not on file  . Highest education level: Not on file  Occupational History  . Occupation: Medical Records at [employer]  Tobacco Use  . Smoking status: Former Smoker    Packs/day: 1.00    Years: 10.00    Pack years: 10.00    Types: Cigarettes    Start date: 89    Quit date: 09/28/2006    Years since quitting: 13.0  . Smokeless tobacco: Never Used  Substance and Sexual Activity  . Alcohol use: No  . Drug use: No  . Sexual activity: Yes    Birth control/protection: None, Condom  Other Topics Concern  . Not on file  Social History Narrative  . Not on file   Social Determinants of Health   Financial Resource Strain:   . Difficulty of Paying Living Expenses: Not on file  Food Insecurity:   . Worried About Programme researcher, broadcasting/film/video in the Last Year: Not on file  . Ran Out of Food in the Last Year: Not on file  Transportation Needs:   . Lack of Transportation (Medical): Not on file  . Lack of Transportation (Non-Medical): Not on file  Physical Activity:   . Days of Exercise per Week: Not on file  . Minutes of Exercise per Session: Not on file  Stress:   . Feeling of Stress :  Not on file  Social Connections:   . Frequency of Communication with Friends and Family: Not on file  . Frequency of Social Gatherings with Friends and Family: Not on file  . Attends Religious Services: Not on file  . Active Member of Clubs or Organizations: Not on file  . Attends Banker Meetings: Not on file  . Marital Status: Not on file   Family History  Problem Relation Age of Onset  . Asthma Sister   . Clotting disorder Father   . Heart attack Father 27  . Clotting disorder Paternal Uncle   . Rheum arthritis Maternal Grandmother   . Throat cancer Maternal Grandmother   . Diabetes Maternal Grandmother   . Heart attack Paternal Uncle        7 uncles   Allergies  Allergen Reactions  . Meclizine Hcl    Prior to Admission medications   Medication Sig Start Date End Date Taking? Authorizing Provider  desogestrel-ethinyl estradiol (MIRCETTE) 0.15-0.02/0.01 MG (21/5) tablet Take 1 tablet by mouth daily. 07/31/19   Lazaro Arms, MD  predniSONE (STERAPRED UNI-PAK 21 TAB) 10 MG (21) TBPK tablet Take by mouth daily. Take 6 tabs by mouth daily  for  2 days, then 5 tabs for 2 days, then 4 tabs for 2 days, then 3 tabs for 2 days, 2 tabs for 2 days, then 1 tab by mouth daily for 2 days 10/11/19   Stacey Drain, [place], PA-C     Positive ROS: Otherwise negative  All other systems have been reviewed and were otherwise negative with the exception of those mentioned in the HPI and as above.  Physical Exam: Constitutional: Alert, well-appearing, no acute distress Ears: External ears without lesions or tenderness. Ear canals are clear bilaterally with intact, clear TMs.  Nasal: External nose without lesions. Septum with minimal deformity..  No obvious polyps noted on nasal endoscopy.  Both middle meatus regions were clear.  She has some granulation tissue anteriorly on the right inferior turbinate with some scabbing that was removed and caused the bleeding.  This granulation tissue on  the right inferior turbinate was cauterized in the office using silver nitrate. Oral: Lips and gums without lesions. Tongue and palate mucosa without lesions. Posterior oropharynx clear. Neck: No palpable adenopathy or masses Respiratory: Breathing comfortably  Skin: No facial/neck lesions or rash noted.  Nasal/sinus endoscopy  Date/Time: 10/23/2019 6:09 PM Performed by: Rozetta Nunnery, MD Authorized by: Rozetta Nunnery, MD   Consent:    Consent obtained:  Verbal   Consent given by:  Patient   Risks discussed:  Pain Procedure details:    Indications: sino-nasal symptoms     Scope location: bilateral nare   Nasal cavity:    edema and bleeding     edema   Sinus:    Right middle meatus: normal     Left middle meatus: normal     Right nasopharynx: normal     Left nasopharynx: normal   Comments:     No polyps were noted within the nasal cavity on either side on nasal endoscopy.  Patient did have some granulation tissue anteriorly along the right inferior turbinate that was cauterized in the office today using silver nitrate.    Assessment: Chronic rhinitis. Bleeding and granulation tissue anteriorly along the right inferior turbinate which may be contributing some to the nasal obstruction on the right side.  This was cauterized using silver nitrate.  Plan: Recommended completing the remaining steroids that she is taking. Also prescribed regular use of Nasacort 2 sprays each nostril at night as this should help with reduction of the swelling within the nasal cavity.  Discussed with her that there were no polyps noted on nasal endoscopy. She will follow-up if symptoms have not significantly improved within the month.  Radene Journey, MD

## 2019-12-15 ENCOUNTER — Other Ambulatory Visit: Payer: Self-pay | Admitting: Women's Health

## 2019-12-15 ENCOUNTER — Telehealth: Payer: Self-pay | Admitting: *Deleted

## 2019-12-15 NOTE — Telephone Encounter (Signed)
Pt states that she has started bleeding again for about four weeks now. She wanted to see if Dr. Despina Hidden would send in megace to get it under control again.

## 2019-12-30 ENCOUNTER — Other Ambulatory Visit: Payer: Self-pay | Admitting: Obstetrics & Gynecology

## 2020-01-13 ENCOUNTER — Other Ambulatory Visit: Payer: Self-pay | Admitting: Obstetrics & Gynecology

## 2020-01-18 ENCOUNTER — Ambulatory Visit
Admission: EM | Admit: 2020-01-18 | Discharge: 2020-01-18 | Disposition: A | Discharge status: Home / Self Care | Payer: Self-pay | Attending: Emergency Medicine | Admitting: Emergency Medicine

## 2020-01-18 ENCOUNTER — Other Ambulatory Visit: Payer: Self-pay

## 2020-01-18 DIAGNOSIS — H66001 Acute suppurative otitis media without spontaneous rupture of ear drum, right ear: Secondary | ICD-10-CM

## 2020-01-18 DIAGNOSIS — H9201 Otalgia, right ear: Secondary | ICD-10-CM

## 2020-01-18 DIAGNOSIS — H60391 Other infective otitis externa, right ear: Secondary | ICD-10-CM

## 2020-01-18 MED ORDER — AMOXICILLIN 500 MG PO CAPS: 500.0000 mg | ORAL_CAPSULE | Freq: Two times a day (BID) | ORAL | 0 refills | Status: AC

## 2020-01-18 MED ORDER — CIPROFLOXACIN-DEXAMETHASONE 0.3-0.1 % OT SUSP: 4.0000 [drp] | Freq: Two times a day (BID) | OTIC | 0 refills | Status: AC

## 2020-01-18 NOTE — ED Triage Notes (Signed)
Pt presents with right ear pain that began on Tuesday

## 2020-01-18 NOTE — Discharge Instructions (Signed)
Rest and drink plenty of fluids Prescribed amoxicillin 500 mg twice for 10 days Prescribed ciprofloxacin ear drops Take medications as directed and to completion Continue to use OTC ibuprofen and/ or tylenol as needed for pain control Follow up with PCP if symptoms persists Return here or go to the ER if you have any new or worsening symptoms fever, chills, nausea, vomiting, increased pain, swelling, worsening symptoms despite medications, etc..Marland Kitchen

## 2020-01-18 NOTE — ED Provider Notes (Signed)
[facility]   [id number] 01/18/20 Arrival Time: 1249  CC: EAR PAIN  SUBJECTIVE: History from: patient.  [name] is a 44 y.o. female who presents with RT ear pain x 2 days.  Denies a precipitating event, such as swimming or wearing ear plugs.  Patient states the pain is constant and achy in character.  Patient has tried OTC medications without relief.  Symptoms are made worse to the touch.  Reports similar symptoms in the past that improved with ear infection.    Denies fever, chills, fatigue, sinus pain, rhinorrhea, ear discharge, sore throat, SOB, wheezing, chest pain, nausea, changes in bowel or bladder habits.    ROS: As per HPI.  All other pertinent ROS negative.     Past Medical History:  Diagnosis Date  . Cigarette smoker   . Dyslipidemia   . Herpes simplex infection   . History of cerebrovascular accident   . History of syncope   . Migraine headache    Past Surgical History:  Procedure Laterality Date  . None     Allergies  Allergen Reactions  . Meclizine Hcl    No current facility-administered medications on file prior to encounter.   Current Outpatient Medications on File Prior to Encounter  Medication Sig Dispense Refill  . desogestrel-ethinyl estradiol (MIRCETTE) 0.15-0.02/0.01 MG (21/5) tablet Take 1 tablet by mouth daily. 1 Package 11  . megestrol (MEGACE) 40 MG tablet 3 TABS X5DAYS, 2 TABS X5DAYS,THEN 1 TAB DAILY TO HELP CONTROL BLEEDING THEN STOP WHEN BLEEDING STOPS 270 tablet 1   Social History   Socioeconomic History  . Marital status: Married    Spouse name: Not on file  . Number of children: 2  . Years of education: Not on file  . Highest education level: Not on file  Occupational History  . Occupation: Medical Records at [employer]  Tobacco Use  . Smoking status: Former Smoker    Packs/day: 1.00    Years: 10.00    Pack years: 10.00    Types: Cigarettes    Start date: 21    Quit date: 09/28/2006    Years since quitting:  13.3  . Smokeless tobacco: Never Used  Substance and Sexual Activity  . Alcohol use: No  . Drug use: No  . Sexual activity: Yes    Birth control/protection: None, Condom  Other Topics Concern  . Not on file  Social History Narrative  . Not on file   Social Determinants of Health   Financial Resource Strain:   . Difficulty of Paying Living Expenses:   Food Insecurity:   . Worried About Programme researcher, broadcasting/film/video in the Last Year:   . Barista in the Last Year:   Transportation Needs:   . Freight forwarder (Medical):   Marland Kitchen Lack of Transportation (Non-Medical):   Physical Activity:   . Days of Exercise per Week:   . Minutes of Exercise per Session:   Stress:   . Feeling of Stress :   Social Connections:   . Frequency of Communication with Friends and Family:   . Frequency of Social Gatherings with Friends and Family:   . Attends Religious Services:   . Active Member of Clubs or Organizations:   . Attends Banker Meetings:   Marland Kitchen Marital Status:   Intimate Partner Violence:   . Fear of Current or Ex-Partner:   . Emotionally Abused:   Marland Kitchen Physically Abused:   . Sexually Abused:  Family History  Problem Relation Age of Onset  . Asthma Sister   . Clotting disorder Father   . Heart attack Father 85  . Clotting disorder Paternal Uncle   . Rheum arthritis Maternal Grandmother   . Throat cancer Maternal Grandmother   . Diabetes Maternal Grandmother   . Heart attack Paternal Uncle        7 uncles    OBJECTIVE:  Vitals:   01/18/20 1259  BP: 134/89  Pulse: (!) 113  Resp: 20  Temp: 98.9 F (37.2 C)  SpO2: 96%    General appearance: alert; well-appearing, nontoxic; speaking in full sentences and tolerating own secretions HEENT: NCAT; Ears: LT EAC clear, LT TM pearly gray, RT EAC erythematous and swollen, TM erythematous; Eyes: PERRL.  EOM grossly intact.Nose: nares patent without rhinorrhea, Throat: oropharynx clear, tonsils non erythematous or enlarged,  uvula midline  Neck: supple without LAD Lungs: unlabored respirations, symmetrical air entry; cough: absent; no respiratory distress; CTAB Heart: regular rate and rhythm.  Skin: warm and dry Psychological: alert and cooperative; normal mood and affect   ASSESSMENT & PLAN:  1. Right ear pain   2. Non-recurrent acute suppurative otitis media of right ear without spontaneous rupture of tympanic membrane   3. Infective otitis externa of right ear     Meds ordered this encounter  Medications  . ciprofloxacin-dexamethasone (CIPRODEX) OTIC suspension    Sig: Place 4 drops into the right ear 2 (two) times daily for 7 days.    Dispense:  7.5 mL    Refill:  0    Order Specific Question:   Supervising Provider    Answer:   Raylene Everts [[id number]]  . amoxicillin (AMOXIL) 500 MG capsule    Sig: Take 1 capsule (500 mg total) by mouth 2 (two) times daily for 10 days.    Dispense:  20 capsule    Refill:  0    Order Specific Question:   Supervising Provider    Answer:   Raylene Everts [[id number]]   Rest and drink plenty of fluids Prescribed amoxicillin 500 mg twice for 10 days Prescribed ciprofloxacin ear drops Take medications as directed and to completion Continue to use OTC ibuprofen and/ or tylenol as needed for pain control Follow up with PCP if symptoms persists Return here or go to the ER if you have any new or worsening symptoms fever, chills, nausea, vomiting, increased pain, swelling, worsening symptoms despite medications, etc...  Reviewed expectations re: course of current medical issues. Questions answered. Outlined signs and symptoms indicating need for more acute intervention. Patient verbalized understanding. After Visit Summary given.         Lestine Box, PA-C 01/18/20 1314

## 2020-04-10 ENCOUNTER — Telehealth: Payer: Self-pay | Admitting: Obstetrics & Gynecology

## 2020-04-10 ENCOUNTER — Other Ambulatory Visit: Payer: Self-pay | Admitting: *Deleted

## 2020-04-10 MED ORDER — MEGESTROL ACETATE 40 MG PO TABS: ORAL_TABLET | ORAL | 1 refills | Status: DC

## 2020-04-10 NOTE — Telephone Encounter (Signed)
Refill request sent to Dr. Eure.  

## 2020-04-10 NOTE — Telephone Encounter (Signed)
Would like megace 40mg  tablet refilled. Would like refill [facility] to [facility] [address]. Pt is aware that she does have one refill left. But would like to go ahead and have more sent before she runs out. And pt states is willing to do in person visit or virtual in order to receive refills.

## 2020-11-05 ENCOUNTER — Other Ambulatory Visit: Payer: Self-pay | Admitting: Obstetrics & Gynecology

## 2021-12-30 ENCOUNTER — Ambulatory Visit
Admission: RE | Admit: 2021-12-30 | Discharge: 2021-12-30 | Disposition: A | Discharge status: Home / Self Care | Payer: Medicaid Other | Source: Ambulatory Visit | Attending: Family Medicine | Admitting: Family Medicine

## 2021-12-30 VITALS — BP 159/127 | HR 114 | Temp 98.5°F | Resp 18

## 2021-12-30 DIAGNOSIS — M549 Dorsalgia, unspecified: Secondary | ICD-10-CM

## 2021-12-30 MED ORDER — CYCLOBENZAPRINE HCL 10 MG PO TABS: 10.0000 mg | ORAL_TABLET | Freq: Three times a day (TID) | ORAL | 0 refills | Status: DC | PRN

## 2021-12-30 MED ORDER — PREDNISONE 20 MG PO TABS: 40.0000 mg | ORAL_TABLET | Freq: Every day | ORAL | 0 refills | Status: DC

## 2021-12-30 NOTE — ED Triage Notes (Signed)
Pt states that 2 weeks ago she woke up and couldn't move because of the pain in her left upper shoulder blade to the upper center of back  ? ?Pt states she has tried Aleve, hot and cold compress and a massager ? ?Pt states she did not injure her back in any way ? ?

## 2021-12-30 NOTE — ED Provider Notes (Signed)
?[facility] ? ? ? ?CSN: [id number] ?Arrival date & time: 12/30/21  1548 ? ? ?  ? ?History   ?Chief Complaint ?Chief Complaint  ?Patient presents with  ? Back Pain  ?  Entered by patient  ? ? ?HPI ?[name] is a 46 y.o. female.  ? ?Presenting today with 2-week history of waxing and waning left upper back pain at the edge of her scapula.  She states no known injury prior to onset.  Worse first thing in the morning when she initially wakes up and tries to get moving.  Movement seems to make it worse and it sometimes feels like it spasming.  Denies weakness, numbness, tingling of the left upper extremity, chest pain, shortness of breath, skin changes to the area.  Taking ibuprofen, using heat and stretches with mild temporary relief of symptoms. ? ? ?Past Medical History:  ?Diagnosis Date  ? Cigarette smoker   ? Dyslipidemia   ? Herpes simplex infection   ? History of cerebrovascular accident   ? History of syncope   ? Migraine headache   ? ? ?Patient Active Problem List  ? Diagnosis Date Noted  ? Migraine without aura and without status migrainosus, not intractable 07/21/2014  ? Bronchitis, acute 12/17/2011  ? Acute upper respiratory infection 08/07/2010  ? CELLULITIS AND ABSCESS OF LEG EXCEPT FOOT 01/29/2009  ? SEBACEOUS CYST 01/29/2009  ? ELEVATED BLOOD PRESSURE 05/09/2008  ? DYSLIPIDEMIA 12/17/2007  ? CIGARETTE SMOKER 12/17/2007  ? Otitis media 12/17/2007  ? HERPES SIMPLEX INFECTION 12/06/2007  ? MIGRAINE HEADACHE 12/06/2007  ? CEREBROVASCULAR ACCIDENT 12/06/2007  ? SYNCOPE 12/06/2007  ? ? ?Past Surgical History:  ?Procedure Laterality Date  ? None    ? ? ?OB History   ? ? Gravida  ?2  ? Para  ?2  ? Term  ?2  ? Preterm  ?   ? AB  ?   ? Living  ?2  ?  ? ? SAB  ?   ? IAB  ?   ? Ectopic  ?   ? Multiple  ?   ? Live Births  ?2  ?   ?  ?  ? ? ? ?Home Medications   ? ?Prior to Admission medications   ?Medication Sig Start Date End Date Taking? Authorizing Provider  ?cyclobenzaprine (FLEXERIL) 10 MG  tablet Take 1 tablet (10 mg total) by mouth 3 (three) times daily as needed for muscle spasms. Do not drink alcohol or drive while taking this medication.  May cause drowsiness 12/30/21  Yes Particia Nearing, PA-C  ?predniSONE (DELTASONE) 20 MG tablet Take 2 tablets (40 mg total) by mouth daily with breakfast. 12/30/21  Yes Particia Nearing, PA-C  ?desogestrel-ethinyl estradiol (MIRCETTE) 0.15-0.02/0.01 MG (21/5) tablet Take 1 tablet by mouth daily. 07/31/19   Lazaro Arms, MD  ?megestrol (MEGACE) 40 MG tablet 3 TABS X 5DAYS, 2TABS X 5DAYS,THEN 1TAB DAILY TO HELP CONTROL BLEEDING THEN STOP WHEN BLEEDING STOPS 11/05/20   Lazaro Arms, MD  ? ? ?Family History ?Family History  ?Problem Relation Age of Onset  ? Asthma Sister   ? Clotting disorder Father   ? Heart attack Father 13  ? Clotting disorder Paternal Uncle   ? Rheum arthritis Maternal Grandmother   ? Throat cancer Maternal Grandmother   ? Diabetes Maternal Grandmother   ? Heart attack Paternal Uncle   ?     7 uncles  ? ? ?Social History ?Social History  ? ?Tobacco  Use  ? Smoking status: Former  ?  Packs/day: 1.00  ?  Years: 10.00  ?  Pack years: 10.00  ?  Types: Cigarettes  ?  Start date: 73  ?  Quit date: 09/28/2006  ?  Years since quitting: 15.2  ? Smokeless tobacco: Never  ?Vaping Use  ? Vaping Use: Never used  ?Substance Use Topics  ? Alcohol use: No  ? Drug use: No  ? ? ? ?Allergies   ?Meclizine hcl ? ? ?Review of Systems ?Review of Systems ?Per HPI ? ?Physical Exam ?Triage Vital Signs ?ED Triage Vitals  ?Enc Vitals Group  ?   BP 12/30/21 1630 (!) 159/127  ?   Pulse Rate 12/30/21 1630 (!) 114  ?   Resp 12/30/21 1630 18  ?   Temp 12/30/21 1630 98.5 ?F (36.9 ?C)  ?   Temp Source 12/30/21 1630 Oral  ?   SpO2 12/30/21 1630 97 %  ?   Weight --   ?   Height --   ?   Head Circumference --   ?   Peak Flow --   ?   Pain Score 12/30/21 1628 9  ?   Pain Loc --   ?   Pain Edu? --   ?   Excl. in GC? --   ? ?No data found. ? ?Updated Vital Signs ?BP (!) 159/127  (BP Location: Right Arm)   Pulse (!) 114   Temp 98.5 ?F (36.9 ?C) (Oral)   Resp 18   SpO2 97%  ? ?Visual Acuity ?Right Eye Distance:   ?Left Eye Distance:   ?Bilateral Distance:   ? ?Right Eye Near:   ?Left Eye Near:    ?Bilateral Near:    ? ?Physical Exam ?Vitals and nursing note reviewed.  ?Constitutional:   ?   Appearance: Normal appearance. She is not ill-appearing.  ?HENT:  ?   Head: Atraumatic.  ?Eyes:  ?   Extraocular Movements: Extraocular movements intact.  ?   Conjunctiva/sclera: Conjunctivae normal.  ?Cardiovascular:  ?   Rate and Rhythm: Normal rate and regular rhythm.  ?   Heart sounds: Normal heart sounds.  ?Pulmonary:  ?   Effort: Pulmonary effort is normal.  ?   Breath sounds: Normal breath sounds. No wheezing or rales.  ?Musculoskeletal:     ?   General: Tenderness present. No swelling, deformity or signs of injury. Normal range of motion.  ?   Cervical back: Normal range of motion and neck supple.  ?   Comments: No midline tenderness over the spinal processes diffusely.  Left medial scapular border tender to palpation and musculature.  Range of motion full and intact.  Normal gait.  Upper and lower extremities strength intact bilaterally  ?Skin: ?   General: Skin is warm and dry.  ?   Findings: No bruising or erythema.  ?Neurological:  ?   Mental Status: She is alert and oriented to person, place, and time.  ?   Comments: bilateral upper extremities neurovascularly intact  ?Psychiatric:     ?   Mood and Affect: Mood normal.     ?   Thought Content: Thought content normal.     ?   Judgment: Judgment normal.  ? ?UC Treatments / Results  ?Labs ?(all labs ordered are listed, but only abnormal results are displayed) ?Labs Reviewed - No data to display ? ?EKG ? ?Radiology ?No results found. ? ?Procedures ?Procedures (including critical care time) ? ?Medications Ordered in UC ?  Medications - No data to display ? ?Initial Impression / Assessment and Plan / UC Course  ?I have reviewed the triage vital  signs and the nursing notes. ? ?Pertinent labs & imaging results that were available during my care of the patient were reviewed by me and considered in my medical decision making (see chart for details). ? ?  ? ?Suspect muscular in nature, treat with prednisone, Flexeril, continued supportive medications and home care.  Return for worsening symptoms. ? ?Final Clinical Impressions(s) / UC Diagnoses  ? ?Final diagnoses:  ?Upper back pain on left side  ? ?Discharge Instructions   ?None ?  ? ?ED Prescriptions   ? ? Medication Sig Dispense Auth. Provider  ? predniSONE (DELTASONE) 20 MG tablet Take 2 tablets (40 mg total) by mouth daily with breakfast. 10 tablet Particia NearingLane, Cheyeanne Roadcap Elizabeth, PA-C  ? cyclobenzaprine (FLEXERIL) 10 MG tablet Take 1 tablet (10 mg total) by mouth 3 (three) times daily as needed for muscle spasms. Do not drink alcohol or drive while taking this medication.  May cause drowsiness 15 tablet Particia NearingLane, Aleshka Corney Elizabeth, [place]PA-C  ? ?  ? ?PDMP not reviewed this encounter. ?  ?Particia NearingLane, Fielding Mault Elizabeth, PA-C ?12/30/21 1651 ? ?

## 2023-12-31 ENCOUNTER — Other Ambulatory Visit: Payer: Self-pay | Admitting: Physician Assistant

## 2023-12-31 DIAGNOSIS — Z1231 Encounter for screening mammogram for malignant neoplasm of breast: Secondary | ICD-10-CM

## 2024-03-21 ENCOUNTER — Ambulatory Visit
Admission: RE | Admit: 2024-03-21 | Discharge: 2024-03-21 | Disposition: A | Discharge status: Home / Self Care | Source: Ambulatory Visit | Attending: Nurse Practitioner | Admitting: Nurse Practitioner

## 2024-03-21 VITALS — BP 101/65 | HR 123 | Temp 98.4°F | Resp 18

## 2024-03-21 DIAGNOSIS — L02416 Cutaneous abscess of left lower limb: Secondary | ICD-10-CM

## 2024-03-21 MED ORDER — DOXYCYCLINE HYCLATE 100 MG PO CAPS: 100.0000 mg | ORAL_CAPSULE | Freq: Two times a day (BID) | ORAL | 0 refills | Status: AC

## 2024-03-21 NOTE — Discharge Instructions (Signed)
 Please take the doxycycline  as prescribed to treat for infection in your left thigh.  Keep the area covered when at work.  Continue warm compresses and/or warm soaks to help with drainage.  Seek care if symptoms worsen or do not improve with treatment.

## 2024-03-21 NOTE — ED Triage Notes (Signed)
 Pt reports bump on the inner left thigh, causing pain x 2 days

## 2024-03-21 NOTE — ED Provider Notes (Signed)
 [facility]    CSN: [id number] Arrival date & time: 03/21/24  1544      History   Chief Complaint Chief Complaint  Patient presents with   Abscess    Inbetween my thighs have a know that's busted - Entered by patient    HPI [name] is a 48 y.o. female.   Patient presents today with swelling, pain, and drainage from an area in the left thigh ongoing for the past couple of days.  Reports when she first noticed the area, it was very small like a bug bite has now increased in size.  She went home after work today and sat in a warm water tub and felt it started to drain.  She denies fevers or nausea/vomiting.  Reports the area is very tender to touch.  Reports history of similar areas in her upper groin recently that fully resolved on their own.    Past Medical History:  Diagnosis Date   Cigarette smoker    Dyslipidemia    Herpes simplex infection    History of cerebrovascular accident    History of syncope    Migraine headache     Patient Active Problem List   Diagnosis Date Noted   Migraine without aura and without status migrainosus, not intractable 07/21/2014   Bronchitis, acute 12/17/2011   Acute upper respiratory infection 08/07/2010   CELLULITIS AND ABSCESS OF LEG EXCEPT FOOT 01/29/2009   SEBACEOUS CYST 01/29/2009   ELEVATED BLOOD PRESSURE 05/09/2008   DYSLIPIDEMIA 12/17/2007   CIGARETTE SMOKER 12/17/2007   Otitis media 12/17/2007   HERPES SIMPLEX INFECTION 12/06/2007   MIGRAINE HEADACHE 12/06/2007   CEREBROVASCULAR ACCIDENT 12/06/2007   SYNCOPE 12/06/2007    Past Surgical History:  Procedure Laterality Date   None      OB History     Gravida  2   Para  2   Term  2   Preterm      AB      Living  2      SAB      IAB      Ectopic      Multiple      Live Births  2            Home Medications    Prior to Admission medications   Medication Sig Start Date End Date Taking? Authorizing Provider  doxycycline   (VIBRAMYCIN ) 100 MG capsule Take 1 capsule (100 mg total) by mouth 2 (two) times daily for 7 days. 03/21/24 03/28/24 Yes Chandra Harlene LABOR, NP  desogestrel -ethinyl estradiol  (MIRCETTE ) 0.15-0.02/0.01 MG (21/5) tablet Take 1 tablet by mouth daily. 07/31/19   Jayne Vonn DEL, MD  megestrol  (MEGACE ) 40 MG tablet 3 TABS X 5DAYS, 2TABS X 5DAYS,THEN 1TAB DAILY TO HELP CONTROL BLEEDING THEN STOP WHEN BLEEDING STOPS 11/05/20   Jayne Vonn DEL, MD    Family History Family History  Problem Relation Age of Onset   Asthma Sister    Clotting disorder Father    Heart attack Father 36   Clotting disorder Paternal Uncle    Rheum arthritis Maternal Grandmother    Throat cancer Maternal Grandmother    Diabetes Maternal Grandmother    Heart attack Paternal Uncle        7 uncles    Social History Social History   Tobacco Use   Smoking status: Former    Current packs/day: 0.00    Average packs/day: 1 pack/day for 10.0 years (10.0 ttl pk-yrs)  Types: Cigarettes    Start date: 39    Quit date: 09/28/2006    Years since quitting: 17.4   Smokeless tobacco: Never  Vaping Use   Vaping status: Never Used  Substance Use Topics   Alcohol use: No   Drug use: No     Allergies   Meclizine hcl   Review of Systems Review of Systems Per HPI  Physical Exam Triage Vital Signs ED Triage Vitals  Encounter Vitals Group     BP 03/21/24 1605 101/65     Girls Systolic BP Percentile --      Girls Diastolic BP Percentile --      Boys Systolic BP Percentile --      Boys Diastolic BP Percentile --      Pulse Rate 03/21/24 1605 (!) 123     Resp 03/21/24 1605 18     Temp 03/21/24 1605 98.4 F (36.9 C)     Temp Source 03/21/24 1605 Oral     SpO2 03/21/24 1605 96 %     Weight --      Height --      Head Circumference --      Peak Flow --      Pain Score 03/21/24 1607 7     Pain Loc --      Pain Education --      Exclude from Growth Chart --    No data found.  Updated Vital Signs BP 101/65 (BP  Location: Right Arm)   Pulse (!) 123   Temp 98.4 F (36.9 C) (Oral)   Resp 18   SpO2 96%   Visual Acuity Right Eye Distance:   Left Eye Distance:   Bilateral Distance:    Right Eye Near:   Left Eye Near:    Bilateral Near:     Physical Exam Vitals and nursing note reviewed.  Constitutional:      General: She is not in acute distress.    Appearance: Normal appearance. She is not toxic-appearing.  HENT:     Mouth/Throat:     Mouth: Mucous membranes are moist.     Pharynx: Oropharynx is clear.  Pulmonary:     Effort: Pulmonary effort is normal. No respiratory distress.   Skin:    General: Skin is warm and dry.     Capillary Refill: Capillary refill takes less than 2 seconds.     Findings: Abscess present.     Comments: Abscess to left thigh approximately 4 cm x 2 cm; there is fluctuance and tenderness with palpation and active drainage   Neurological:     Mental Status: She is alert and oriented to person, place, and time.   Psychiatric:        Behavior: Behavior is cooperative.      UC Treatments / Results  Labs (all labs ordered are listed, but only abnormal results are displayed) Labs Reviewed - No data to display  EKG   Radiology No results found.  Procedures Procedures (including critical care time)  Medications Ordered in UC Medications - No data to display  Initial Impression / Assessment and Plan / UC Course  I have reviewed the triage vital signs and the nursing notes.  Pertinent labs & imaging results that were available during my care of the patient were reviewed by me and considered in my medical decision making (see chart for details).   Patient is well-appearing, normotensive, afebrile, is mildly tachycardic, not tachypneic, oxygenating well on room air.   1.  Abscess of left thigh No indication for I&D today, abscess is actively draining Treat with doxycycline  100 mg twice daily for 7 days Other supportive care discussed including wound  care ER and return precautions discussed  The patient was given the opportunity to ask questions.  All questions answered to their satisfaction.  The patient is in agreement to this plan.  Final Clinical Impressions(s) / UC Diagnoses   Final diagnoses:  Abscess of left thigh     Discharge Instructions      Please take the doxycycline  as prescribed to treat for infection in your left thigh.  Keep the area covered when at work.  Continue warm compresses and/or warm soaks to help with drainage.  Seek care if symptoms worsen or do not improve with treatment.    ED Prescriptions     Medication Sig Dispense Auth. Provider   doxycycline  (VIBRAMYCIN ) 100 MG capsule Take 1 capsule (100 mg total) by mouth 2 (two) times daily for 7 days. 14 capsule Chandra Harlene LABOR, NP      PDMP not reviewed this encounter.   Chandra Harlene LABOR, NP 03/21/24 [phone]

## 2024-10-21 ENCOUNTER — Inpatient Hospital Stay: Admission: RE | Admit: 2024-10-21 | Discharge: 2024-10-21 | Payer: Self-pay | Attending: Family Medicine

## 2024-10-21 VITALS — BP 135/89 | HR 93 | Temp 98.1°F | Resp 20

## 2024-10-21 DIAGNOSIS — K649 Unspecified hemorrhoids: Secondary | ICD-10-CM

## 2024-10-21 MED ORDER — HYDROCORTISONE (PERIANAL) 2.5 % EX CREA: 1.0000 | TOPICAL_CREAM | Freq: Two times a day (BID) | CUTANEOUS | 0 refills | Status: AC

## 2024-10-21 NOTE — Discharge Instructions (Addendum)
 You may also try using over the counter Preparation H Rapid Relief Cream with Lidocaine as directed on the packaging.

## 2024-10-21 NOTE — ED Triage Notes (Signed)
 Pt reports she has had some burning near rectum, developed a bump near rectum, and cannot sleep due to anal problem x  2 days    Applied tucks to area and took tylenol   Denies constipation Last BM this morning Denies straining with BM Denies anal intercourse

## 2024-10-25 NOTE — ED Provider Notes (Signed)
 " [facility]   [id number] 10/21/24 Arrival Time: 0947  ASSESSMENT & PLAN:  1. Acute hemorrhoid   Non-thrombosed. Should resolve in the next few days. Miralax to keep stool soft.   Follow-up Information     [facility] at [facility].   Specialty: Urgent Care Why: If worsening or failing to improve as anticipated. Contact information: [address] [place]  [address] [phone]               Meds ordered this encounter  Medications   hydrocortisone  (ANUSOL -HC) 2.5 % rectal cream    Sig: Place 1 Application rectally 2 (two) times daily.    Dispense:  30 g    Refill:  0     Reviewed expectations re: course of current medical issues. Questions answered. Outlined signs and symptoms indicating need for more acute intervention. Understanding verbalized. After Visit Summary given.   SUBJECTIVE: History from: Patient. [name] is a 49 y.o. female. Pt reports she has had some burning near rectum, developed a bump near rectum, and cannot sleep due to anal problem x  2 days    Applied tucks to area and took tylenol   Denies constipation Last BM this morning Denies straining with BM Denies anal intercourse    OBJECTIVE:  Vitals:   10/21/24 1055  BP: 135/89  Pulse: 93  Resp: 20  Temp: 98.1 F (36.7 C)  TempSrc: Oral  SpO2: 97%    General appearance: alert; no distress Rectal: (nurse chaperone present) approx 0.5 cm non-bleeding but painful hemorrhoid appreciated; able to partial reduce; no sign of abscess Psychological: alert and cooperative; normal mood and affect   Allergies[1]  Past Medical History:  Diagnosis Date   Cigarette smoker    Dyslipidemia    Herpes simplex infection    History of cerebrovascular accident    History of syncope    Migraine headache    Social History   Socioeconomic History   Marital status: Married    Spouse name: Not on file   Number of  children: 2   Years of education: Not on file   Highest education level: Not on file  Occupational History   Occupation: Medical Records at [employer]  Tobacco Use   Smoking status: Former    Current packs/day: 0.00    Average packs/day: 1 pack/day for 10.0 years (10.0 ttl pk-yrs)    Types: Cigarettes    Start date: 25    Quit date: 09/28/2006    Years since quitting: 18.0   Smokeless tobacco: Never  Vaping Use   Vaping status: Never Used  Substance and Sexual Activity   Alcohol use: No   Drug use: No   Sexual activity: Yes    Birth control/protection: None, Condom  Other Topics Concern   Not on file  Social History Narrative   Not on file   Social Drivers of Health   Tobacco Use: Medium Risk (10/21/2024)   Patient History    Smoking Tobacco Use: Former    Smokeless Tobacco Use: Never    Passive Exposure: Not on file  Financial Resource Strain: Not on File (12/09/2022)   Received from [facility]    Financial Resource Strain: 0  Food Insecurity: Not at Risk (01/05/2023)   Received from [facility] Insecurity    Food: 1  Transportation Needs: Not at Risk (01/05/2023)   Received from [facility]  Transportation: 1  Physical Activity: Not on File (12/09/2022)   Received from [facility]   Physical Activity    Physical Activity: 0  Stress: Not on File (12/09/2022)   Received from [facility]   Stress    Stress: 0  Social Connections: Not on File (06/01/2023)   Received from [facility]   Social Connections    Connectedness: 0  Intimate Partner Violence: Not on file  Depression (PHQ2-9): Not on file  Alcohol Screen: Not on file  Housing: Low Risk (12/15/2023)   Received from [facility]   Epic    What is your living situation today?: I have a steady place to live    Think about the place you live. Do you have problems with any of the following? Choose all that apply:: Not on file  Utilities: Not on file  Health Literacy: Not on file   Family  History  Problem Relation Age of Onset   Asthma Sister    Clotting disorder Father    Heart attack Father 19   Clotting disorder Paternal Uncle    Rheum arthritis Maternal Grandmother    Throat cancer Maternal Grandmother    Diabetes Maternal Grandmother    Heart attack Paternal Uncle        7 uncles   Past Surgical History:  Procedure Laterality Date   None        [1]  Allergies Allergen Reactions   Meclizine Hcl      Jovin Fester, MD 10/25/24 [phone]  "
